# Patient Record
Sex: Male | Born: 1950
Health system: Southern US, Community
[De-identification: ages and names within clinical notes are randomized; demographics above are authoritative.]

## PROBLEM LIST (undated history)

## (undated) DIAGNOSIS — M2540 Effusion, unspecified joint: Secondary | ICD-10-CM

## (undated) DIAGNOSIS — R51 Headache: Secondary | ICD-10-CM

## (undated) DIAGNOSIS — M62838 Other muscle spasm: Secondary | ICD-10-CM

## (undated) DIAGNOSIS — M1990 Unspecified osteoarthritis, unspecified site: Secondary | ICD-10-CM

## (undated) DIAGNOSIS — M2550 Pain in unspecified joint: Secondary | ICD-10-CM

## (undated) DIAGNOSIS — E119 Type 2 diabetes mellitus without complications: Secondary | ICD-10-CM

## (undated) DIAGNOSIS — M549 Dorsalgia, unspecified: Secondary | ICD-10-CM

## (undated) DIAGNOSIS — I10 Essential (primary) hypertension: Secondary | ICD-10-CM

## (undated) DIAGNOSIS — K5790 Diverticulosis of intestine, part unspecified, without perforation or abscess without bleeding: Secondary | ICD-10-CM

## (undated) DIAGNOSIS — E785 Hyperlipidemia, unspecified: Secondary | ICD-10-CM

## (undated) DIAGNOSIS — K219 Gastro-esophageal reflux disease without esophagitis: Secondary | ICD-10-CM

## (undated) HISTORY — PX: KNEE ARTHROSCOPY: SUR90

## (undated) HISTORY — PX: COLONOSCOPY: SHX174

## (undated) HISTORY — PX: OTHER SURGICAL HISTORY: SHX169

## (undated) HISTORY — PX: ESOPHAGOGASTRODUODENOSCOPY: SHX1529

---

## 2013-02-14 ENCOUNTER — Encounter (HOSPITAL_COMMUNITY): Payer: Self-pay | Admitting: Respiratory Therapy

## 2013-02-18 ENCOUNTER — Encounter (HOSPITAL_COMMUNITY)
Admission: RE | Admit: 2013-02-18 | Discharge: 2013-02-18 | Disposition: A | Discharge status: Home / Self Care | Payer: BC Managed Care – PPO | Source: Ambulatory Visit | Attending: Orthopedic Surgery | Admitting: Orthopedic Surgery

## 2013-02-18 ENCOUNTER — Encounter (HOSPITAL_COMMUNITY): Payer: Self-pay

## 2013-02-18 ENCOUNTER — Encounter (HOSPITAL_COMMUNITY)
Admission: RE | Admit: 2013-02-18 | Discharge: 2013-02-18 | Disposition: A | Discharge status: Home / Self Care | Payer: BC Managed Care – PPO | Source: Ambulatory Visit | Attending: Anesthesiology | Admitting: Anesthesiology

## 2013-02-18 DIAGNOSIS — I10 Essential (primary) hypertension: Secondary | ICD-10-CM | POA: Insufficient documentation

## 2013-02-18 DIAGNOSIS — E119 Type 2 diabetes mellitus without complications: Secondary | ICD-10-CM | POA: Insufficient documentation

## 2013-02-18 DIAGNOSIS — Z01818 Encounter for other preprocedural examination: Secondary | ICD-10-CM | POA: Insufficient documentation

## 2013-02-18 DIAGNOSIS — K5730 Diverticulosis of large intestine without perforation or abscess without bleeding: Secondary | ICD-10-CM | POA: Insufficient documentation

## 2013-02-18 DIAGNOSIS — I4949 Other premature depolarization: Secondary | ICD-10-CM | POA: Insufficient documentation

## 2013-02-18 DIAGNOSIS — K219 Gastro-esophageal reflux disease without esophagitis: Secondary | ICD-10-CM | POA: Insufficient documentation

## 2013-02-18 DIAGNOSIS — Z01810 Encounter for preprocedural cardiovascular examination: Secondary | ICD-10-CM | POA: Insufficient documentation

## 2013-02-18 DIAGNOSIS — Z01812 Encounter for preprocedural laboratory examination: Secondary | ICD-10-CM | POA: Insufficient documentation

## 2013-02-18 DIAGNOSIS — M129 Arthropathy, unspecified: Secondary | ICD-10-CM | POA: Insufficient documentation

## 2013-02-18 DIAGNOSIS — E785 Hyperlipidemia, unspecified: Secondary | ICD-10-CM | POA: Insufficient documentation

## 2013-02-18 DIAGNOSIS — R51 Headache: Secondary | ICD-10-CM | POA: Insufficient documentation

## 2013-02-18 DIAGNOSIS — I4510 Unspecified right bundle-branch block: Secondary | ICD-10-CM | POA: Insufficient documentation

## 2013-02-18 HISTORY — DX: Pain in unspecified joint: M25.50

## 2013-02-18 HISTORY — DX: Type 2 diabetes mellitus without complications: E11.9

## 2013-02-18 HISTORY — DX: Hyperlipidemia, unspecified: E78.5

## 2013-02-18 HISTORY — DX: Effusion, unspecified joint: M25.40

## 2013-02-18 HISTORY — DX: Essential (primary) hypertension: I10

## 2013-02-18 HISTORY — DX: Headache: R51

## 2013-02-18 HISTORY — DX: Dorsalgia, unspecified: M54.9

## 2013-02-18 HISTORY — DX: Other muscle spasm: M62.838

## 2013-02-18 HISTORY — DX: Diverticulosis of intestine, part unspecified, without perforation or abscess without bleeding: K57.90

## 2013-02-18 HISTORY — DX: Gastro-esophageal reflux disease without esophagitis: K21.9

## 2013-02-18 HISTORY — DX: Unspecified osteoarthritis, unspecified site: M19.90

## 2013-02-18 LAB — TYPE AND SCREEN
ABO/RH(D): O POS
Antibody Screen: NEGATIVE

## 2013-02-18 LAB — CBC
Hemoglobin: 14 g/dL (ref 13.0–17.0)
MCH: 28.1 pg (ref 26.0–34.0)
MCV: 80.1 fL (ref 78.0–100.0)
Platelets: 219 10*3/uL (ref 150–400)
RBC: 4.98 MIL/uL (ref 4.22–5.81)
WBC: 5.6 10*3/uL (ref 4.0–10.5)

## 2013-02-18 LAB — BASIC METABOLIC PANEL
CO2: 29 mEq/L (ref 19–32)
Calcium: 9.6 mg/dL (ref 8.4–10.5)
GFR calc non Af Amer: 57 mL/min — ABNORMAL LOW (ref >90)
Glucose, Bld: 141 mg/dL — ABNORMAL HIGH (ref 70–99)
Potassium: 3.9 mEq/L (ref 3.5–5.1)
Sodium: 142 mEq/L (ref 135–145)

## 2013-02-18 LAB — SURGICAL PCR SCREEN: Staphylococcus aureus: POSITIVE — AB

## 2013-02-18 LAB — APTT: aPTT: 32 seconds (ref 24–37)

## 2013-02-18 LAB — PROTIME-INR: Prothrombin Time: 13.4 seconds (ref 11.6–15.2)

## 2013-02-18 MED ORDER — CHLORHEXIDINE GLUCONATE 4 % EX LIQD: 60.0000 mL | Freq: Once | CUTANEOUS | Status: DC

## 2013-02-18 NOTE — Progress Notes (Signed)
Pt doesn't have a cardiologist  Dr.Michael Leonette Most is Medical Md   Stress test > 5yrs ago  Denies ever having an echo/heart cath  Denies CXR or EKG in past yr

## 2013-02-18 NOTE — Pre-Procedure Instructions (Signed)
Jimmy Frazier  02/18/2013   Your procedure is scheduled on:  Fri, April 25 @ 12:30 PM  Report to [facility] at 10:30 AM.  Call this number if you have problems the morning of surgery: [phone]   Remember:   Do not eat food or drink liquids after midnight.   Take these medicines the morning of surgery with A SIP OF WATER: Felodipine(Plendil),Robaxin(Methocarbamol), and Omeprazole(Prilosec)               Stop taking your Ibuprofen and using Diclofenac gel.No Aspirin,Goody's,BC's,Aleve,Fish Oil,or Herbal Medications   Do not wear jewelry  Do not wear lotions, powders, or colognes. You may wear deodorant.  Men may shave face and neck.  Do not bring valuables to the hospital.  Contacts, dentures or bridgework may not be worn into surgery.  Leave suitcase in the car. After surgery it may be brought to your room.  For patients admitted to the hospital, checkout time is 11:00 AM the day of  discharge.   Patients discharged the day of surgery will not be allowed to drive  home.    Special Instructions: Shower using CHG 2 nights before surgery and the night before surgery.  If you shower the day of surgery use CHG.  Use special wash - you have one bottle of CHG for all showers.  You should use approximately 1/3 of the bottle for each shower.   Please read over the following fact sheets that you were given: Pain Booklet, Coughing and Deep Breathing, Blood Transfusion Information, MRSA Information and Surgical Site Infection Prevention

## 2013-02-21 NOTE — Progress Notes (Signed)
Anesthesia Chart Review:  Patient is a 62 year old male scheduled for left total shoulder arthroplasty versus reverse total shoulder arthroplasty on 02/25/13 by Dr. Ranell Patrick.  History includes non-smoker, HTN, HLD, headaches, diverticulosis, arthritis, GERD, "borderline" DM2.  PCP is Dr. Loyal Jacobson who cleared patient for this procedure.  EKG on 02/18/13 showed SR with occasional PVC, LAD, incomplete right BBB, moderate voltage criteria for LVH.  Incomplete right BBB and singe PVC is new, otherwise his EKG is not significantly changed since 11/19/11 (PCP).  He reported a stress test > 5 years ago.  CXR report on 02/18/13 showed suboptimal inspiration with bibasilar atelectasis. No acute findings identified.   Preoperative labs noted.  Cr 1.32.  Glucose 141.  CBC, PT/PTT WNL.  Patient has been medically cleared for this procedure.  If no acute changes then would anticipate he could proceed as planned.  He will be evaluated by his assigned anesthesiologist on the day of surgery.  Velna Ochs [facility]/Anesthesiology Phone [phone] 02/21/2013 10:27 AM

## 2013-02-24 MED ORDER — CEFAZOLIN SODIUM-DEXTROSE 2-3 GM-% IV SOLR
2.0000 g | INTRAVENOUS | Status: AC
  Administered 2013-02-25: 2 g via INTRAVENOUS
  Filled 2013-02-24: qty 50

## 2013-02-24 NOTE — Progress Notes (Signed)
Spoke with wife regarding time change. Verbalized understanding

## 2013-02-24 NOTE — H&P (Signed)
Jimmy Frazier is an 62 y.o. male.    Chief Complaint: left shoulder pain   HPI: Pt is a 62 y.o. male complaining of left shoulder pain for multiple years. Pain had continually increased since the beginning. X-rays in the clinic show end-stage arthritic changes of the left shoulder. Pt has tried various conservative treatments which have failed to alleviate their symptoms, including therapy and injections. Various options are discussed with the patient. Risks, benefits and expectations were discussed with the patient. Patient understand the risks, benefits and expectations and wishes to proceed with surgery.   PCP:  Sid Falcon, MD  D/C Plans:  Home with HHPT  PMH: Past Medical History  Diagnosis Date  . Hypertension     takes Prinizide and Felodipine daily  . Hyperlipidemia     but doesn't require meds  . Headache     hx of cluster HA;but none in a while  . Muscle spasm     Robaxin daily prn  . Back pain     occasionally  . Joint pain   . Joint swelling   . Arthritis   . GERD (gastroesophageal reflux disease)     takes Omeprazole daily prn  . Diverticulosis   . Diabetes mellitus without complication     borderline;but no meds required yet;diet and exercise    PSH: Past Surgical History  Procedure Laterality Date  . Knee arthroscopy      2 on right and 1 on left  . Right shoulder arthroscopy    . Esophagogastroduodenoscopy    . Colonoscopy      Social History:  reports that he has never smoked. He does not have any smokeless tobacco history on file. He reports that  drinks alcohol. He reports that he does not use illicit drugs.  Allergies:  No Known Allergies  Medications: Current Facility-Administered Medications  Medication Dose Route Frequency Provider Last Rate Last Dose  . [START ON 02/25/2013] ceFAZolin (ANCEF) IVPB 2 g/50 mL premix  2 g Intravenous On Call to OR Thea Gist, PA-C       Current Outpatient Prescriptions  Medication Sig Dispense  Refill  . aspirin 81 MG tablet Take 81 mg by mouth daily.      . diclofenac sodium (VOLTAREN) 1 % GEL Apply 4 g topically 4 (four) times daily as needed (for joint pain).       . felodipine (PLENDIL) 10 MG 24 hr tablet Take 10 mg by mouth daily.      Marland Kitchen ibuprofen (ADVIL,MOTRIN) 200 MG tablet Take 800 mg by mouth every 8 (eight) hours as needed for pain.      Marland Kitchen lisinopril-hydrochlorothiazide (PRINZIDE,ZESTORETIC) 20-12.5 MG per tablet Take 1 tablet by mouth daily.      . methocarbamol (ROBAXIN) 500 MG tablet Take 500 mg by mouth 4 (four) times daily as needed (for muscle pain).       Marland Kitchen omeprazole (PRILOSEC) 20 MG capsule Take 20 mg by mouth daily.        No results found for this or any previous visit (from the past 48 hour(s)). No results found.  ROS: Pain with rom of the left upper extremity  Physical Exam:  Alert and oriented 62 y.o. male in no acute distress Cranial nerves 2-12 intact Cervical spine: full rom with no tenderness, nv intact distally Chest: active breath sounds bilaterally, no wheeze rhonchi or rales Heart: regular rate and rhythm, no murmur Abd: non tender non distended with active bowel sounds Hip  is stable with rom  Moderate pain and decreased rom due to arthritis nv intact distally Mild decrease in strength  Assessment/Plan Assessment: left shoulder end stage arthritis  Plan: Patient will undergo a left total shoulder vs reverse by Dr. Ranell Patrick at [facility]. Risks benefits and expectations were discussed with the patient. Patient understand risks, benefits and expectations and wishes to proceed.

## 2013-02-25 ENCOUNTER — Encounter (HOSPITAL_COMMUNITY): Payer: Self-pay | Admitting: Vascular Surgery

## 2013-02-25 ENCOUNTER — Inpatient Hospital Stay (HOSPITAL_COMMUNITY)
Admission: RE | Admit: 2013-02-25 | Discharge: 2013-02-27 | DRG: 491 | Disposition: A | Discharge status: Home / Self Care | Payer: BC Managed Care – PPO | Source: Ambulatory Visit | Attending: Orthopedic Surgery | Admitting: Orthopedic Surgery

## 2013-02-25 ENCOUNTER — Inpatient Hospital Stay (HOSPITAL_COMMUNITY): Payer: BC Managed Care – PPO

## 2013-02-25 ENCOUNTER — Encounter (HOSPITAL_COMMUNITY): Payer: Self-pay | Admitting: Certified Registered Nurse Anesthetist

## 2013-02-25 ENCOUNTER — Ambulatory Visit (HOSPITAL_COMMUNITY): Payer: BC Managed Care – PPO | Admitting: Certified Registered Nurse Anesthetist

## 2013-02-25 ENCOUNTER — Encounter (HOSPITAL_COMMUNITY)
Admission: RE | Disposition: A | Discharge status: Home / Self Care | Payer: Self-pay | Source: Ambulatory Visit | Attending: Orthopedic Surgery

## 2013-02-25 DIAGNOSIS — K219 Gastro-esophageal reflux disease without esophagitis: Secondary | ICD-10-CM | POA: Diagnosis present

## 2013-02-25 DIAGNOSIS — M19019 Primary osteoarthritis, unspecified shoulder: Principal | ICD-10-CM | POA: Diagnosis present

## 2013-02-25 DIAGNOSIS — I10 Essential (primary) hypertension: Secondary | ICD-10-CM | POA: Diagnosis present

## 2013-02-25 DIAGNOSIS — Z79899 Other long term (current) drug therapy: Secondary | ICD-10-CM

## 2013-02-25 DIAGNOSIS — E785 Hyperlipidemia, unspecified: Secondary | ICD-10-CM | POA: Diagnosis present

## 2013-02-25 DIAGNOSIS — Z7982 Long term (current) use of aspirin: Secondary | ICD-10-CM

## 2013-02-25 DIAGNOSIS — E119 Type 2 diabetes mellitus without complications: Secondary | ICD-10-CM | POA: Diagnosis present

## 2013-02-25 DIAGNOSIS — M62838 Other muscle spasm: Secondary | ICD-10-CM | POA: Diagnosis present

## 2013-02-25 DIAGNOSIS — Z96619 Presence of unspecified artificial shoulder joint: Secondary | ICD-10-CM

## 2013-02-25 HISTORY — PX: TOTAL SHOULDER ARTHROPLASTY: SHX126

## 2013-02-25 LAB — GLUCOSE, CAPILLARY
Glucose-Capillary: 116 mg/dL — ABNORMAL HIGH (ref 70–99)
Glucose-Capillary: 154 mg/dL — ABNORMAL HIGH (ref 70–99)

## 2013-02-25 SURGERY — ARTHROPLASTY, SHOULDER, TOTAL
Anesthesia: General | Site: Shoulder | Laterality: Left | Wound class: Clean

## 2013-02-25 MED ORDER — ONDANSETRON HCL 4 MG PO TABS: 4.0000 mg | ORAL_TABLET | Freq: Four times a day (QID) | ORAL | Status: DC | PRN

## 2013-02-25 MED ORDER — OXYCODONE-ACETAMINOPHEN 5-325 MG PO TABS
1.0000 | ORAL_TABLET | ORAL | Status: DC | PRN
  Administered 2013-02-25 – 2013-02-27 (×6): 2 via ORAL
  Filled 2013-02-25 (×7): qty 2

## 2013-02-25 MED ORDER — BUPIVACAINE-EPINEPHRINE PF 0.5-1:200000 % IJ SOLN
INTRAMUSCULAR | Status: DC | PRN
  Administered 2013-02-25: 150 mg

## 2013-02-25 MED ORDER — METHOCARBAMOL 100 MG/ML IJ SOLN
500.0000 mg | Freq: Four times a day (QID) | INTRAVENOUS | Status: DC | PRN
  Filled 2013-02-25: qty 5

## 2013-02-25 MED ORDER — ONDANSETRON HCL 4 MG/2ML IJ SOLN
INTRAMUSCULAR | Status: DC | PRN
  Administered 2013-02-25: 4 mg via INTRAVENOUS

## 2013-02-25 MED ORDER — LISINOPRIL 20 MG PO TABS
20.0000 mg | ORAL_TABLET | Freq: Every day | ORAL | Status: DC
  Administered 2013-02-25 – 2013-02-26 (×2): 20 mg via ORAL
  Filled 2013-02-25 (×3): qty 1

## 2013-02-25 MED ORDER — EPHEDRINE SULFATE 50 MG/ML IJ SOLN
INTRAMUSCULAR | Status: DC | PRN
  Administered 2013-02-25: 5 mg via INTRAVENOUS
  Administered 2013-02-25 (×2): 10 mg via INTRAVENOUS
  Administered 2013-02-25 (×3): 5 mg via INTRAVENOUS
  Administered 2013-02-25: 10 mg via INTRAVENOUS

## 2013-02-25 MED ORDER — PANTOPRAZOLE SODIUM 40 MG PO TBEC
40.0000 mg | DELAYED_RELEASE_TABLET | Freq: Every day | ORAL | Status: DC
  Administered 2013-02-26: 40 mg via ORAL
  Filled 2013-02-25: qty 1

## 2013-02-25 MED ORDER — HYDROMORPHONE HCL PF 1 MG/ML IJ SOLN
0.5000 mg | INTRAMUSCULAR | Status: DC | PRN
  Administered 2013-02-26: 0.5 mg via INTRAVENOUS
  Filled 2013-02-25: qty 1

## 2013-02-25 MED ORDER — METHOCARBAMOL 500 MG PO TABS
500.0000 mg | ORAL_TABLET | Freq: Four times a day (QID) | ORAL | Status: DC | PRN
  Administered 2013-02-26 (×3): 500 mg via ORAL
  Filled 2013-02-25 (×3): qty 1

## 2013-02-25 MED ORDER — POTASSIUM CHLORIDE IN NACL 20-0.9 MEQ/L-% IV SOLN
INTRAVENOUS | Status: DC
  Administered 2013-02-25: 18:00:00 via INTRAVENOUS
  Filled 2013-02-25 (×3): qty 1000

## 2013-02-25 MED ORDER — PHENYLEPHRINE HCL 10 MG/ML IJ SOLN
INTRAMUSCULAR | Status: DC | PRN
  Administered 2013-02-25 (×4): 160 ug via INTRAVENOUS
  Administered 2013-02-25: 80 ug via INTRAVENOUS
  Administered 2013-02-25 (×2): 40 ug via INTRAVENOUS

## 2013-02-25 MED ORDER — OXYCODONE-ACETAMINOPHEN 5-325 MG PO TABS: 1.0000 | ORAL_TABLET | ORAL | Status: DC | PRN

## 2013-02-25 MED ORDER — OXYCODONE HCL 5 MG/5ML PO SOLN: 5.0000 mg | Freq: Once | ORAL | Status: DC | PRN

## 2013-02-25 MED ORDER — PHENOL 1.4 % MT LIQD: 1.0000 | OROMUCOSAL | Status: DC | PRN

## 2013-02-25 MED ORDER — BUPIVACAINE-EPINEPHRINE PF 0.25-1:200000 % IJ SOLN
INTRAMUSCULAR | Status: AC
  Filled 2013-02-25: qty 30

## 2013-02-25 MED ORDER — METOCLOPRAMIDE HCL 10 MG PO TABS: 5.0000 mg | ORAL_TABLET | Freq: Three times a day (TID) | ORAL | Status: DC | PRN

## 2013-02-25 MED ORDER — FENTANYL CITRATE 0.05 MG/ML IJ SOLN: 50.0000 ug | INTRAMUSCULAR | Status: DC | PRN

## 2013-02-25 MED ORDER — BUPIVACAINE-EPINEPHRINE 0.25% -1:200000 IJ SOLN
INTRAMUSCULAR | Status: DC | PRN
  Administered 2013-02-25 (×2): 9 mL

## 2013-02-25 MED ORDER — THROMBIN 5000 UNITS EX SOLR
CUTANEOUS | Status: DC | PRN
  Administered 2013-02-25: 5000 [IU] via TOPICAL

## 2013-02-25 MED ORDER — LIDOCAINE HCL 4 % MT SOLN
OROMUCOSAL | Status: DC | PRN
  Administered 2013-02-25: 4 mL via TOPICAL

## 2013-02-25 MED ORDER — MENTHOL 3 MG MT LOZG: 1.0000 | LOZENGE | OROMUCOSAL | Status: DC | PRN

## 2013-02-25 MED ORDER — PROPOFOL 10 MG/ML IV BOLUS
INTRAVENOUS | Status: DC | PRN
  Administered 2013-02-25: 120 mg via INTRAVENOUS

## 2013-02-25 MED ORDER — MIDAZOLAM HCL 2 MG/2ML IJ SOLN: 1.0000 mg | INTRAMUSCULAR | Status: DC | PRN

## 2013-02-25 MED ORDER — METHOCARBAMOL 500 MG PO TABS
500.0000 mg | ORAL_TABLET | Freq: Four times a day (QID) | ORAL | Status: DC | PRN
  Administered 2013-02-27: 500 mg via ORAL
  Filled 2013-02-25 (×2): qty 1

## 2013-02-25 MED ORDER — LISINOPRIL-HYDROCHLOROTHIAZIDE 20-12.5 MG PO TABS: 1.0000 | ORAL_TABLET | Freq: Every day | ORAL | Status: DC

## 2013-02-25 MED ORDER — BISACODYL 10 MG RE SUPP: 10.0000 mg | Freq: Every day | RECTAL | Status: DC | PRN

## 2013-02-25 MED ORDER — OXYCODONE HCL 5 MG PO TABS: 5.0000 mg | ORAL_TABLET | Freq: Once | ORAL | Status: DC | PRN

## 2013-02-25 MED ORDER — MIDAZOLAM HCL 5 MG/5ML IJ SOLN
INTRAMUSCULAR | Status: DC | PRN
  Administered 2013-02-25: 2 mg via INTRAVENOUS

## 2013-02-25 MED ORDER — ACETAMINOPHEN 650 MG RE SUPP: 650.0000 mg | Freq: Four times a day (QID) | RECTAL | Status: DC | PRN

## 2013-02-25 MED ORDER — ASPIRIN 81 MG PO TABS: 81.0000 mg | ORAL_TABLET | Freq: Every day | ORAL | Status: DC

## 2013-02-25 MED ORDER — FENTANYL CITRATE 0.05 MG/ML IJ SOLN
INTRAMUSCULAR | Status: AC
  Administered 2013-02-25: 100 ug via INTRAVENOUS
  Filled 2013-02-25: qty 2

## 2013-02-25 MED ORDER — LACTATED RINGERS IV SOLN
INTRAVENOUS | Status: DC
  Administered 2013-02-25: 50 mL/h via INTRAVENOUS

## 2013-02-25 MED ORDER — ONDANSETRON HCL 4 MG/2ML IJ SOLN: 4.0000 mg | Freq: Four times a day (QID) | INTRAMUSCULAR | Status: DC | PRN

## 2013-02-25 MED ORDER — DEXAMETHASONE SODIUM PHOSPHATE 4 MG/ML IJ SOLN
INTRAMUSCULAR | Status: DC | PRN
  Administered 2013-02-25: 8 mg

## 2013-02-25 MED ORDER — SODIUM CHLORIDE 0.9 % IR SOLN
Status: DC | PRN
  Administered 2013-02-25: 1000 mL

## 2013-02-25 MED ORDER — FELODIPINE ER 10 MG PO TB24
10.0000 mg | ORAL_TABLET | Freq: Every day | ORAL | Status: DC
  Administered 2013-02-26: 10 mg via ORAL
  Filled 2013-02-25 (×2): qty 1

## 2013-02-25 MED ORDER — SUCCINYLCHOLINE CHLORIDE 20 MG/ML IJ SOLN
INTRAMUSCULAR | Status: DC | PRN
  Administered 2013-02-25: 100 mg via INTRAVENOUS

## 2013-02-25 MED ORDER — ASPIRIN EC 81 MG PO TBEC
81.0000 mg | DELAYED_RELEASE_TABLET | Freq: Every day | ORAL | Status: DC
  Administered 2013-02-25 – 2013-02-26 (×2): 81 mg via ORAL
  Filled 2013-02-25 (×3): qty 1

## 2013-02-25 MED ORDER — METOCLOPRAMIDE HCL 5 MG/ML IJ SOLN: 5.0000 mg | Freq: Three times a day (TID) | INTRAMUSCULAR | Status: DC | PRN

## 2013-02-25 MED ORDER — PROMETHAZINE HCL 25 MG/ML IJ SOLN: 6.2500 mg | INTRAMUSCULAR | Status: DC | PRN

## 2013-02-25 MED ORDER — MIDAZOLAM HCL 2 MG/2ML IJ SOLN
INTRAMUSCULAR | Status: AC
  Administered 2013-02-25: 2 mg via INTRAVENOUS
  Filled 2013-02-25: qty 2

## 2013-02-25 MED ORDER — ACETAMINOPHEN 325 MG PO TABS: 650.0000 mg | ORAL_TABLET | Freq: Four times a day (QID) | ORAL | Status: DC | PRN

## 2013-02-25 MED ORDER — LIDOCAINE HCL (CARDIAC) 20 MG/ML IV SOLN
INTRAVENOUS | Status: DC | PRN
  Administered 2013-02-25: 30 mg via INTRAVENOUS

## 2013-02-25 MED ORDER — LACTATED RINGERS IV SOLN
INTRAVENOUS | Status: DC | PRN
  Administered 2013-02-25 (×2): via INTRAVENOUS

## 2013-02-25 MED ORDER — THROMBIN 5000 UNITS EX SOLR
CUTANEOUS | Status: AC
  Filled 2013-02-25: qty 5000

## 2013-02-25 MED ORDER — FENTANYL CITRATE 0.05 MG/ML IJ SOLN
INTRAMUSCULAR | Status: DC | PRN
  Administered 2013-02-25: 50 ug via INTRAVENOUS

## 2013-02-25 MED ORDER — HYDROCHLOROTHIAZIDE 12.5 MG PO CAPS
12.5000 mg | ORAL_CAPSULE | Freq: Every day | ORAL | Status: DC
  Administered 2013-02-25 – 2013-02-26 (×2): 12.5 mg via ORAL
  Filled 2013-02-25 (×3): qty 1

## 2013-02-25 MED ORDER — HYDROMORPHONE HCL PF 1 MG/ML IJ SOLN: 0.2500 mg | INTRAMUSCULAR | Status: DC | PRN

## 2013-02-25 SURGICAL SUPPLY — 79 items
BLADE SAG 18X100X1.27 (BLADE) ×2 IMPLANT
BLADE SAW SAG 73X25 THK (BLADE) ×1
BLADE SAW SGTL 73X25 THK (BLADE) ×1 IMPLANT
BUR SURG 4X8 MED (BURR) ×1 IMPLANT
BURR SURG 4X8 MED (BURR) ×2
CEMENT BONE DEPUY (Cement) ×2 IMPLANT
CLOTH BEACON ORANGE TIMEOUT ST (SAFETY) ×2 IMPLANT
CLSR STERI-STRIP ANTIMIC 1/2X4 (GAUZE/BANDAGES/DRESSINGS) ×2 IMPLANT
COVER SURGICAL LIGHT HANDLE (MISCELLANEOUS) ×2 IMPLANT
DRAPE INCISE IOBAN 66X45 STRL (DRAPES) ×4 IMPLANT
DRAPE U-SHAPE 47X51 STRL (DRAPES) ×2 IMPLANT
DRAPE X-RAY CASS 24X20 (DRAPES) IMPLANT
DRILL BIT 5/64 (BIT) ×2 IMPLANT
DRSG ADAPTIC 3X8 NADH LF (GAUZE/BANDAGES/DRESSINGS) ×2 IMPLANT
DRSG PAD ABDOMINAL 8X10 ST (GAUZE/BANDAGES/DRESSINGS) ×4 IMPLANT
DURAPREP 26ML APPLICATOR (WOUND CARE) ×2 IMPLANT
ELECT BLADE 4.0 EZ CLEAN MEGAD (MISCELLANEOUS) ×2
ELECT NEEDLE TIP 2.8 STRL (NEEDLE) ×2 IMPLANT
ELECT REM PT RETURN 9FT ADLT (ELECTROSURGICAL) ×2
ELECTRODE BLDE 4.0 EZ CLN MEGD (MISCELLANEOUS) ×1 IMPLANT
ELECTRODE REM PT RTRN 9FT ADLT (ELECTROSURGICAL) ×1 IMPLANT
GLENOID ANCHOR PEG CROSSLK 44 (Orthopedic Implant) ×2 IMPLANT
GLOVE BIOGEL PI ORTHO PRO 7.5 (GLOVE) ×1
GLOVE BIOGEL PI ORTHO PRO SZ7 (GLOVE) ×1
GLOVE BIOGEL PI ORTHO PRO SZ8 (GLOVE) ×1
GLOVE ORTHO TXT STRL SZ7.5 (GLOVE) ×2 IMPLANT
GLOVE PI ORTHO PRO STRL 7.5 (GLOVE) ×1 IMPLANT
GLOVE PI ORTHO PRO STRL SZ7 (GLOVE) ×1 IMPLANT
GLOVE PI ORTHO PRO STRL SZ8 (GLOVE) ×1 IMPLANT
GLOVE SURG ORTHO 8.5 STRL (GLOVE) ×4 IMPLANT
GLOVE SURG SS PI 7.0 STRL IVOR (GLOVE) ×2 IMPLANT
GOWN STRL NON-REIN LRG LVL3 (GOWN DISPOSABLE) IMPLANT
GOWN STRL REIN XL XLG (GOWN DISPOSABLE) ×6 IMPLANT
HANDPIECE INTERPULSE COAX TIP (DISPOSABLE)
HEAD HUM ECCENTRIC 44X18 STRL (Trauma) ×2 IMPLANT
IV CATH 14GX2 1/4 (CATHETERS) ×2 IMPLANT
KIT BASIN OR (CUSTOM PROCEDURE TRAY) ×2 IMPLANT
KIT ROOM TURNOVER OR (KITS) ×2 IMPLANT
MANIFOLD NEPTUNE II (INSTRUMENTS) ×2 IMPLANT
NDL SUT 6 .5 CRC .975X.05 MAYO (NEEDLE) ×1 IMPLANT
NEEDLE 1/2 CIR MAYO (NEEDLE) ×2 IMPLANT
NEEDLE HYPO 25GX1X1/2 BEV (NEEDLE) ×2 IMPLANT
NEEDLE MAYO TAPER (NEEDLE) ×1
NS IRRIG 1000ML POUR BTL (IV SOLUTION) ×2 IMPLANT
PACK SHOULDER (CUSTOM PROCEDURE TRAY) ×2 IMPLANT
PAD ARMBOARD 7.5X6 YLW CONV (MISCELLANEOUS) ×4 IMPLANT
PIN METAGLENE 2.5 (PIN) ×2 IMPLANT
SET HNDPC FAN SPRY TIP SCT (DISPOSABLE) IMPLANT
SLING ARM FOAM STRAP LRG (SOFTGOODS) ×2 IMPLANT
SLING ARM FOAM STRAP MED (SOFTGOODS) IMPLANT
SLING ARM IMMOBILIZER LRG (SOFTGOODS) IMPLANT
SLING ARM IMMOBILIZER MED (SOFTGOODS) IMPLANT
SMARTMIX MINI TOWER (MISCELLANEOUS) ×2
SPONGE GAUZE 4X4 12PLY (GAUZE/BANDAGES/DRESSINGS) ×2 IMPLANT
SPONGE LAP 18X18 X RAY DECT (DISPOSABLE) ×2 IMPLANT
SPONGE LAP 4X18 X RAY DECT (DISPOSABLE) ×2 IMPLANT
SPONGE SURGIFOAM ABS GEL SZ50 (HEMOSTASIS) ×2 IMPLANT
STEM HUMERAL 12MM (Trauma) ×2 IMPLANT
STRIP CLOSURE SKIN 1/2X4 (GAUZE/BANDAGES/DRESSINGS) ×2 IMPLANT
SUCTION FRAZIER TIP 10 FR DISP (SUCTIONS) ×2 IMPLANT
SUT FIBERWIRE #2 38 T-5 BLUE (SUTURE) ×6
SUT MNCRL AB 4-0 PS2 18 (SUTURE) ×2 IMPLANT
SUT VIC AB 0 CT1 27 (SUTURE) ×1
SUT VIC AB 0 CT1 27XBRD ANBCTR (SUTURE) ×1 IMPLANT
SUT VIC AB 2-0 CT1 27 (SUTURE) ×1
SUT VIC AB 2-0 CT1 TAPERPNT 27 (SUTURE) ×1 IMPLANT
SUT VICRYL 0 CT 1 36IN (SUTURE) ×2 IMPLANT
SUT VICRYL AB 2 0 TIES (SUTURE) IMPLANT
SUTURE FIBERWR #2 38 T-5 BLUE (SUTURE) ×3 IMPLANT
SWABSTICK BENZOIN STERILE (MISCELLANEOUS) IMPLANT
SYR CONTROL 10ML LL (SYRINGE) ×4 IMPLANT
TAPE CLOTH SURG 6X10 WHT LF (GAUZE/BANDAGES/DRESSINGS) ×2 IMPLANT
TOWEL OR 17X24 6PK STRL BLUE (TOWEL DISPOSABLE) ×2 IMPLANT
TOWEL OR 17X26 10 PK STRL BLUE (TOWEL DISPOSABLE) ×2 IMPLANT
TOWER CARTRIDGE SMART MIX (DISPOSABLE) IMPLANT
TOWER SMARTMIX MINI (MISCELLANEOUS) ×1 IMPLANT
TRAY FOLEY CATH 14FR (SET/KITS/TRAYS/PACK) ×2 IMPLANT
WATER STERILE IRR 1000ML POUR (IV SOLUTION) ×2 IMPLANT
YANKAUER SUCT BULB TIP NO VENT (SUCTIONS) IMPLANT

## 2013-02-25 NOTE — Progress Notes (Signed)
Report rec'd from Mark RN, assuming care of patient   

## 2013-02-25 NOTE — Anesthesia Procedure Notes (Addendum)
Anesthesia Regional Block:  Interscalene brachial plexus block  Pre-Anesthetic Checklist: ,, timeout performed, Correct Patient, Correct Site, Correct Laterality, Correct Procedure, Correct Position, site marked, Risks and benefits discussed,  Surgical consent,  Pre-op evaluation,  At surgeon's request and post-op pain management  Laterality: Right  Prep: chloraprep       Needles:  Injection technique: Single-shot  Needle Type: Echogenic Stimulator Needle     Needle Length: 5cm 5 cm Needle Gauge: 22 and 22 G    Additional Needles:  Procedures: ultrasound guided (picture in chart) and nerve stimulator Interscalene brachial plexus block  Nerve Stimulator or Paresthesia:  Response: bicep contraction, 0.45 mA,   Additional Responses:   Narrative:  Start time: 02/25/2013 10:40 AM End time: 02/25/2013 10:50 AM Injection made incrementally with aspirations every 5 mL.  Performed by: Personally  Anesthesiologist: J. Adonis Huguenin, MD  Additional Notes: Functioning IV was confirmed and monitors applied.  A 50mm 22ga echogenic arrow stimulator was used. Sterile prep and drape,hand hygiene and sterile gloves were used.Ultrasound guidance: relevant anatomy identified, needle position confirmed, local anesthetic spread visualized around nerve(s)., vascular puncture avoided.  Image printed for medical record.  Negative aspiration and negative test dose prior to incremental administration of local anesthetic. The patient tolerated the procedure well.  Interscalene brachial plexus block Procedure Name: Intubation Date/Time: 02/25/2013 11:12 AM Performed by: Angelica Pou Pre-anesthesia Checklist: Patient identified, Timeout performed, Emergency Drugs available, Suction available and Patient being monitored Patient Re-evaluated:Patient Re-evaluated prior to inductionOxygen Delivery Method: Circle system utilized Preoxygenation: Pre-oxygenation with 100% oxygen Intubation Type: IV  induction Ventilation: Mask ventilation without difficulty Laryngoscope Size: Mac and 4 Grade View: Grade I Tube type: Oral Tube size: 7.5 mm Number of attempts: 1 Airway Equipment and Method: Stylet Placement Confirmation: ETT inserted through vocal cords under direct vision,  breath sounds checked- equal and bilateral and positive ETCO2 Secured at: 23 cm Tube secured with: Tape Dental Injury: Teeth and Oropharynx as per pre-operative assessment

## 2013-02-25 NOTE — Anesthesia Preprocedure Evaluation (Signed)
Anesthesia Evaluation  Patient identified by MRN, date of birth, ID band Patient awake    Reviewed: Allergy & Precautions, H&P , NPO status , Patient's Chart, lab work & pertinent test results  Airway Mallampati: II TM Distance: >3 FB Neck ROM: full    Dental  (+) Teeth Intact and Dental Advidsory Given   Pulmonary neg pulmonary ROS,    Pulmonary exam normal       Cardiovascular hypertension, On Medications     Neuro/Psych  Headaches, negative psych ROS   GI/Hepatic Neg liver ROS, GERD-  Medicated,  Endo/Other  diabetes  Renal/GU negative Renal ROS  negative genitourinary   Musculoskeletal   Abdominal Normal abdominal exam  (+)   Peds  Hematology negative hematology ROS (+)   Anesthesia Other Findings   Reproductive/Obstetrics                           Anesthesia Physical Anesthesia Plan  ASA: II  Anesthesia Plan: General ETT   Post-op Pain Management: MAC Combined w/ Regional for Post-op pain   Induction:   Airway Management Planned:   Additional Equipment:   Intra-op Plan:   Post-operative Plan:   Informed Consent: I have reviewed the patients History and Physical, chart, labs and discussed the procedure including the risks, benefits and alternatives for the proposed anesthesia with the patient or authorized representative who has indicated his/her understanding and acceptance.   Dental Advisory Given  Plan Discussed with: Anesthesiologist, CRNA and Surgeon  Anesthesia Plan Comments:         Anesthesia Quick Evaluation

## 2013-02-25 NOTE — Transfer of Care (Signed)
Immediate Anesthesia Transfer of Care Note  Patient: Jimmy Frazier  Procedure(s) Performed: Procedure(s): LEFT TOTAL SHOULDER ARTHROPLASTY  (Left)  Patient Location: PACU  Anesthesia Type:General  Level of Consciousness: awake, alert , sedated and patient cooperative  Airway & Oxygen Therapy: Patient Spontanous Breathing and Patient connected to nasal cannula oxygen  Post-op Assessment: Report given to PACU RN and Post -op Vital signs reviewed and stable  Post vital signs: Reviewed and stable  Complications: No apparent anesthesia complications

## 2013-02-25 NOTE — Brief Op Note (Signed)
02/25/2013  1:45 PM  PATIENT:  Jimmy Frazier  62 y.o. male  PRE-OPERATIVE DIAGNOSIS:  LEFT SHOULDER OA, end stage  POST-OPERATIVE DIAGNOSIS:  LEFT SHOULDER OA, end stage  PROCEDURE:  Procedure(s): LEFT TOTAL SHOULDER ARTHROPLASTY  (Left), [employer]  SURGEON:  Surgeon(s) and Role:    * Verlee Rossetti, MD - Primary  PHYSICIAN ASSISTANT:   ASSISTANTS: Thea Gist, PA-C   ANESTHESIA:   regional and general  EBL:  Total I/O In: 1700 [I.V.:1700] Out: 50 [Blood:50]  BLOOD ADMINISTERED:none  DRAINS: none   LOCAL MEDICATIONS USED:  MARCAINE     SPECIMEN:  No Specimen  DISPOSITION OF SPECIMEN:  N/A  COUNTS:  YES  TOURNIQUET:  * No tourniquets in log *  DICTATION: .Other Dictation: Dictation Number [id number]  PLAN OF CARE: Admit to inpatient   PATIENT DISPOSITION:  PACU - hemodynamically stable.   Delay start of Pharmacological VTE agent (>24hrs) due to surgical blood loss or risk of bleeding: not applicable

## 2013-02-25 NOTE — Preoperative (Signed)
Beta Blockers   Reason not to administer Beta Blockers:Not Applicable 

## 2013-02-25 NOTE — Interval H&P Note (Signed)
History and Physical Interval Note:  02/25/2013 10:25 AM  Jimmy Frazier  has presented today for surgery, with the diagnosis of LEFT SHOULDER OA   The various methods of treatment have been discussed with the patient and family. After consideration of risks, benefits and other options for treatment, the patient has consented to  Procedure(s): LEFT TOTAL SHOULDER ARTHROPLASTY VERSES  (Left) REVERSE TOTAL SHOULDER ARTHROPLASTY (Left) as a surgical intervention .  The patient's history has been reviewed, patient examined, no change in status, stable for surgery.  I have reviewed the patient's chart and labs.  Questions were answered to the patient's satisfaction.     Legend Pecore,STEVEN R

## 2013-02-25 NOTE — Anesthesia Postprocedure Evaluation (Signed)
  Anesthesia Post-op Note  Patient: Jimmy Frazier  Procedure(s) Performed: Procedure(s): LEFT TOTAL SHOULDER ARTHROPLASTY  (Left)  Patient Location: PACU  Anesthesia Type:GA combined with regional for post-op pain  Level of Consciousness: awake, alert  and oriented  Airway and Oxygen Therapy: Patient Spontanous Breathing and Patient connected to nasal cannula oxygen  Post-op Pain: none  Post-op Assessment: Post-op Vital signs reviewed  Post-op Vital Signs: Reviewed  Complications: No apparent anesthesia complications

## 2013-02-26 LAB — BASIC METABOLIC PANEL
BUN: 17 mg/dL (ref 6–23)
Chloride: 105 mEq/L (ref 96–112)
Creatinine, Ser: 1.09 mg/dL (ref 0.50–1.35)
GFR calc non Af Amer: 72 mL/min — ABNORMAL LOW (ref >90)
Glucose, Bld: 169 mg/dL — ABNORMAL HIGH (ref 70–99)
Potassium: 4.2 mEq/L (ref 3.5–5.1)

## 2013-02-26 NOTE — Progress Notes (Signed)
Subjective: 1 Day Post-Op Procedure(s) (LRB): LEFT TOTAL SHOULDER ARTHROPLASTY  (Left) Patient reports pain as 3 on 0-10 scale. Weakness of elbow flexors and wrist extension on left with diffuse sensory changes. Good radial Pulse. Will splint his wrist.   Objective: Vital signs in last 24 hours: Temp:  [97.1 F (36.2 C)-98.7 F (37.1 C)] 97.1 F (36.2 C) (04/26 0623) Pulse Rate:  [76-101] 96 (04/26 0623) Resp:  [17-25] 20 (04/26 0623) BP: (98-130)/(62-85) 130/62 mmHg (04/26 0623) SpO2:  [94 %-100 %] 100 % (04/26 0623)  Intake/Output from previous day: 04/25 0701 - 04/26 0700 In: 2677.5 [P.O.:720; I.V.:1957.5] Out: 325 [Urine:275; Blood:50] Intake/Output this shift:     Recent Labs  02/26/13 0515  HGB 12.1*    Recent Labs  02/26/13 0515  HCT 34.3*    Recent Labs  02/26/13 0515  NA 138  K 4.2  CL 105  CO2 24  BUN 17  CREATININE 1.09  GLUCOSE 169*  CALCIUM 8.9   No results found for this basename: LABPT, INR,  in the last 72 hours  Neuropraxia involving Left Upper.  Assessment/Plan: 1 Day Post-Op Procedure(s) (LRB): LEFT TOTAL SHOULDER ARTHROPLASTY  (Left) Up with therapy  Norvella Loscalzo A 02/26/2013, 8:18 AM

## 2013-02-26 NOTE — Progress Notes (Signed)
Orthopedic Tech Progress Note Patient Details:  Jimmy Frazier 21-Oct-1951 [id number] Spoke with ordering doctor; doctor stated she wanted velcro wrist splint for Left wrist. Splint applied to patient in room. Tolerated well.  Ortho Devices Type of Ortho Device: Velcro wrist splint Ortho Device/Splint Location: Left Ortho Device/Splint Interventions: Application   Asia R Thompson 02/26/2013, 8:47 AM

## 2013-02-26 NOTE — Evaluation (Signed)
Occupational Therapy Evaluation Patient Details Name: Jimmy Frazier MRN: [id number] DOB: 16-Jul-1951 Today's Date: 02/26/2013 Time: 4098-1191 OT Time Calculation (min): 39 min  OT Assessment / Plan / Recommendation Clinical Impression  Pt is doing well POD 1 from L TSA.  Instructed in pendulums, ADL using hemitechniques, sling use, positioning in bed and chair.  Pt has weakness in L elbow.  Needs instruction in supine exercises per Dr. Ranell Patrick' protocol.    OT Assessment  Patient needs continued OT Services    Follow Up Recommendations  Supervision - Intermittent (progress at MD recommendation)    Barriers to Discharge      Equipment Recommendations  None recommended by OT    Recommendations for Other Services    Frequency  Min 2X/week    Precautions / Restrictions Precautions Precautions: Shoulder Type of Shoulder Precautions: NWB, Dr. Ranell Patrick protocol for TSA Precaution Booklet Issued: Yes (comment) Restrictions Weight Bearing Restrictions: Yes LUE Weight Bearing: Non weight bearing   Pertinent Vitals/Pain No c/o pain.    ADL  Eating/Feeding: Set up Where Assessed - Eating/Feeding: Chair Grooming: Independent;Wash/dry hands;Wash/dry face;Teeth care Where Assessed - Grooming: Unsupported standing Upper Body Bathing: Minimal assistance Where Assessed - Upper Body Bathing: Unsupported standing Lower Body Bathing: Minimal assistance Where Assessed - Lower Body Bathing: Unsupported standing Upper Body Dressing: Minimal assistance Where Assessed - Upper Body Dressing: Unsupported standing Lower Body Dressing: Minimal assistance Where Assessed - Lower Body Dressing: Unsupported sitting;Supported sit to stand Toilet Transfer: Independent Toilet Transfer Method: Sit to Barista: Comfort height toilet Toileting - Clothing Manipulation and Hygiene: Modified independent Where Assessed - Toileting Clothing Manipulation and Hygiene:  Standing Transfers/Ambulation Related to ADLs: independent, pushes IV pole ADL Comments: Instructed in hemitechniques for ADL. Sling use.    OT Diagnosis: Generalized weakness;Acute pain  OT Problem List: Decreased strength;Decreased range of motion;Impaired UE functional use;Pain OT Treatment Interventions: Self-care/ADL training;Therapeutic exercise;Therapeutic activities;Patient/family education   OT Goals Acute Rehab OT Goals OT Goal Formulation: With patient Time For Goal Achievement: 03/05/13 Potential to Achieve Goals: Good Arm Goals Pt Will Perform AROM: Independently;Left upper extremity;1 set;10 reps (L elbow to hand) Arm Goal: AROM - Progress: Goal set today Miscellaneous OT Goals Miscellaneous OT Goal #1: Pt will be independent in pendulum exercises and AAROM exercises in supine as per MD protocol. OT Goal: Miscellaneous Goal #1 - Progress: Goal set today Miscellaneous OT Goal #2: Pt and wife will be independent in donning and doffing sling and verbalize wearing schedule. OT Goal: Miscellaneous Goal #2 - Progress: Goal set today Miscellaneous OT Goal #3: Pt will verbalize knowledge in hemitechniques for bathing and dressing. OT Goal: Miscellaneous Goal #3 - Progress: Goal set today  Visit Information  Last OT Received On: 02/26/13 Assistance Needed: +1    Subjective Data  Subjective: "I'm retired from [employer]." Patient Stated Goal: Home with wife, return to PLOF.   Prior Functioning     Home Living Lives With: Spouse Available Help at Discharge: Family;Available PRN/intermittently Type of Home: House Home Layout: One level Bathroom Shower/Tub: Engineer, manufacturing systems: Standard Home Adaptive Equipment: None Prior Function Level of Independence: Independent Able to Take Stairs?: Yes Driving: Yes Vocation: Retired Musician: No difficulties Dominant Hand: Right         Vision/Perception Vision - History Baseline Vision: Wears  glasses all the time Patient Visual Report: No change from baseline   Cognition  Cognition Arousal/Alertness: Awake/alert Behavior During Therapy: WFL for tasks assessed/performed Overall Cognitive Status: Within Functional  Limits for tasks assessed    Extremity/Trunk Assessment Right Upper Extremity Assessment RUE ROM/Strength/Tone: Within functional levels Left Upper Extremity Assessment LUE ROM/Strength/Tone: Deficits LUE ROM/Strength/Tone Deficits: Full AROM forearm/wrist/fingers, 2/5 elbow, shoulder not tested (Dr Darrelyn Hillock ordered wrist cock up splint) LUE Sensation:  (intact temp and pain) Trunk Assessment Trunk Assessment: Normal     Mobility Bed Mobility Bed Mobility: Not assessed (instructed to avoid use of L UE to push up) Transfers Transfers: Sit to Stand;Stand to Sit Sit to Stand: 7: Independent;With armrests;From chair/3-in-1;From toilet Stand to Sit: 7: Independent;With upper extremity assist;To chair/3-in-1;To toilet     Exercise Shoulder Exercises Pendulum Exercise: Left;20 reps;Standing Elbow Flexion: AAROM;Left;10 reps;Standing Elbow Extension: AAROM;10 reps;Standing;Left Wrist Flexion: AROM;10 reps;Standing;Left Wrist Extension: AROM;Left;10 reps;Standing Digit Composite Flexion: AROM;10 reps;Standing;Left Composite Extension: AROM;10 reps;Standing;Left Donning/doffing shirt without moving shoulder: Minimal assistance Method for sponge bathing under operated UE: Minimal assistance Donning/doffing sling/immobilizer: Minimal assistance Correct positioning of sling/immobilizer: Set-up Pendulum exercises (written home exercise program): Supervision/safety ROM for elbow, wrist and digits of operated UE: Minimal assistance Sling wearing schedule (on at all times/off for ADL's): Independent Proper positioning of operated UE when showering: Supervision/safety Positioning of UE while sleeping: Independent   Balance     End of Session OT - End of  Session Activity Tolerance: Patient tolerated treatment well Patient left: in chair;with call bell/phone within reach;with family/visitor present  GO     Evern Bio 02/26/2013, 11:12 AM [phone]

## 2013-02-26 NOTE — Op Note (Signed)
NAMEBRIGGS, Jimmy NO.:  Frazier  MEDICAL RECORD NO.:  [id number]  LOCATION:  [id number]                        FACILITY:  [facility]  PHYSICIAN:  Almedia Balls. Ranell Patrick, M.D. DATE OF BIRTH:  August 12, 1951  DATE OF PROCEDURE:  02/25/2013 DATE OF DISCHARGE:                              OPERATIVE REPORT   PREOPERATIVE DIAGNOSIS:  Left shoulder end-stage osteoarthritis.  POSTOPERATIVE DIAGNOSIS:  Left shoulder end-stage osteoarthritis.  PROCEDURE PERFORMED:  Left total shoulder arthroplasty using DePuy global advantage system.  ATTENDING SURGEON:  Almedia Balls. Ranell Patrick, M.D.  ASSISTANT:  Donnie Coffin. Dixon, PA-C, who was scrubbed during the entire procedure and necessary for satisfactory completion of surgery.  ANESTHESIA:  General anesthesia was used plus interscalene block.  ESTIMATED BLOOD LOSS:  100 mL.  FLUID REPLACEMENT:  1500 mL crystalloids.  INSTRUMENT COUNTS:  Correct.  COMPLICATIONS:  There were no complications.  ANTIBIOTICS:  Perioperative antibiotics were given.  INDICATIONS:  The patient is a 62 year old male who suffered from long- standing left shoulder arthritis.  The patient's pain has progressed to the point, where he is now having pain at night and pain at rest.  The patient desires total shoulder arthroplasty.  Risks and benefits of surgery were discussed.  Informed consent obtained.  DESCRIPTION OF PROCEDURE:  After an adequate level of anesthesia achieved, the patient was positioned in the modified beach-chair position.  Left shoulder correctly identified, sterilely prepped and draped in the usual manner.  Time-out called.  We irrigated the patient's shoulder using standard deltopectoral incision.  This was started with coracoid process extending down to the anterior humeral shaft.  Dissection down through subcutaneous tissues using the Bovie. We identified the cephalic vein, took it laterally with the deltoid. The pectoralis was taken medially.   Conjoined tendon identified and retracted medially.  The patient's shoulder was subluxed posteriorly, making exposure difficult.  We released the subscapularis which was in good condition off the lesser tuberosity subperiosteally.  We placed #2 hi-fi suture, a modified Mason-Allen suture technique in the free end of the tendon, removed the anterior capsule off the back of the subscapularis.  Biceps tendon was tenotomized.  Once that was accomplished, we went ahead and released the inferior capsule, progressively externally rotated the humerus.  There were large spurs noted off the humeral side.  We then took our neck resection guide and set it on about 10 degrees of retroversion due to the patient's extreme retroversion on his glenoid and we went ahead and resected the humerus, humeral head.  We took the head to the back table, removed the bone graft for later in the procedure.  We then went ahead and progressively externally rotated, placed a Brown retractor, and then reamed the humerus to a size 12 diameter.  We then went ahead and broached for a size 12 Global Advantage stem with the again about 10 degrees of retroversion total on the prosthesis and the cut.  The anterior fin was adjacent to the biceps groove.  Next, we went ahead and retracted the humerus posteriorly, again quite difficult exposure due to the extreme retroversion, we removed the labrum and the capsule protecting the axillary nerve, which was palpated and  visible.  We then went ahead and used a bur anteriorly to bur down the anterior glenoid, so that we could ream.  We found our center point which we had to compromise a little bit truly correcting all of the retroversion.  We created much to narrow of the glenoid, best corrected about half of it and then placed our drill guide or reamer over the top of that guide pin.  We were pleased with the resection.  We want up resecting probably half a cm bone deep anteriorly and  just getting flushed posteriorly, but we were pleased again with being able to create a nice bed for the glenoid component. We next went ahead and drilled out our central post for the anchor PEG glenoid prosthesis from DePuy.  We then placed our guide for drilling our peripheral 3 holes which will be cemented.  We drilled our superior one first and the anterior inferior, posterior inferior.  We then went ahead and dried the peripheral holes with epi soaked Gelfoam, and then mixed vacuum mixed cement on the back table DePuy 1 cement and then used a small introducer for the cemented and reducing that under pressure on the superior anterior inferior posterior inferior holes.  We took our anchor PEG glenoid, impacted it in position, held that until all cement was hard in the table about 12-13 minutes for this DePuy 1.  We then removed our retractors.  We are pleased with the component stability, externally rotated, placed drill holes in our humerus for suture to repair the subscapularis.  We went ahead and using a available bone graft in an impaction grafting technique, impacted the size 12 Global Advantage stem into position.  We then placed trial heads to see what would be the best coverage.  It looked like the 44 x 18 eccentric was the best coverage and gave good stability upon reduction.  At this point, we went ahead and thoroughly irrigated the shoulder, removed our trial head, impacted a 44 x 18 head in position and then anatomic repair of the subscapularis back to the rotator interval into the lesser tuberosity.  Once that was achieved, we closed the deltopectoral interval, again irrigating with 0 Vicryl suture, followed by 2-0 Vicryl subcutaneous closure and 4-0 Monocryl for skin.  Steri-Strips were applied, followed by sterile dressing.  The patient tolerated the procedure well.     Almedia Balls. Ranell Patrick, M.D.     SRN/MEDQ  D:  02/25/2013  T:  02/26/2013  Job:  [id number]

## 2013-02-27 NOTE — Discharge Summary (Signed)
Physician Discharge Summary   Patient ID: Jimmy Frazier MRN: [id number] DOB/AGE: 60-Jun-1952 60 y.o.  Admit date: 02/25/2013 Discharge date: 02/27/2013  Admission Diagnoses:  Left shoulder severe osteoarthritis  Discharge Diagnoses:  Same   Surgeries: Procedure(s): LEFT TOTAL SHOULDER ARTHROPLASTY  on 02/25/2013   Consultants: OT  Discharged Condition: Stable  Hospital Course: Jimmy Frazier is an 62 y.o. male who was admitted 02/25/2013 with a chief complaint of severe left shoulder pain and loss of function, and found to have a diagnosis of left shoulder osteoarthritis.  They were brought to the operating room on 02/25/2013 and underwent the above named procedures.    The patient had an uncomplicated hospital course and was stable for discharge.  Recent vital signs:  Filed Vitals:   02/27/13 0704  BP: 112/66  Pulse: 77  Temp: 97.9 F (36.6 C)  Resp: 16    Recent laboratory studies:  Results for orders placed during the hospital encounter of 02/25/13  GLUCOSE, CAPILLARY      Result Value Range   Glucose-Capillary 116 (*) 70 - 99 mg/dL  GLUCOSE, CAPILLARY      Result Value Range   Glucose-Capillary 167 (*) 70 - 99 mg/dL  BASIC METABOLIC PANEL      Result Value Range   Sodium 138  135 - 145 mEq/L   Potassium 4.2  3.5 - 5.1 mEq/L   Chloride 105  96 - 112 mEq/L   CO2 24  19 - 32 mEq/L   Glucose, Bld 169 (*) 70 - 99 mg/dL   BUN 17  6 - 23 mg/dL   Creatinine, Ser 4.09  0.50 - 1.35 mg/dL   Calcium 8.9  8.4 - 81.1 mg/dL   GFR calc non Af Amer 72 (*) >90 mL/min   GFR calc Af Amer 84 (*) >90 mL/min  HEMOGLOBIN AND HEMATOCRIT, BLOOD      Result Value Range   Hemoglobin 12.1 (*) 13.0 - 17.0 g/dL   HCT 91.4 (*) 78.2 - 95.6 %  GLUCOSE, CAPILLARY      Result Value Range   Glucose-Capillary 154 (*) 70 - 99 mg/dL   Comment 1 Notify RN     Comment 2 Documented in Chart      Discharge Medications:     Medication List    STOP taking these medications       ibuprofen  200 MG tablet  Commonly known as:  ADVIL,MOTRIN      TAKE these medications       aspirin 81 MG tablet  Take 81 mg by mouth daily.     diclofenac sodium 1 % Gel  Commonly known as:  VOLTAREN  Apply 4 g topically 4 (four) times daily as needed (for joint pain).     felodipine 10 MG 24 hr tablet  Commonly known as:  PLENDIL  Take 10 mg by mouth daily.     lisinopril-hydrochlorothiazide 20-12.5 MG per tablet  Commonly known as:  PRINZIDE,ZESTORETIC  Take 1 tablet by mouth daily.     methocarbamol 500 MG tablet  Commonly known as:  ROBAXIN  Take 500 mg by mouth 4 (four) times daily as needed (for muscle pain).     omeprazole 20 MG capsule  Commonly known as:  PRILOSEC  Take 20 mg by mouth daily.     oxyCODONE-acetaminophen 5-325 MG per tablet  Commonly known as:  ROXICET  Take 1 tablet by mouth every 4 (four) hours as needed for pain.  Diagnostic Studies: Dg Chest 2 View  02/18/2013  *RADIOLOGY REPORT*  Clinical Data: Preoperative respiratory examination for left shoulder arthroplasty.  History of hypertension and diabetes.  CHEST - 2 VIEW  Comparison: None.  Findings: There are low lung volumes with mild resulting bibasilar atelectasis.  Slight blunting of the left costophrenic angle on the frontal examination is probably due to atelectasis and adjacent pleural thickening.  There is no significant pleural effusion on the lateral view.  The heart size and mediastinal contours are normal for the degree of inspiration.  There is a thoracolumbar scoliosis.  Glenohumeral degenerative changes are present on the left.  The left glenoid fossa appears somewhat hypoplastic.  IMPRESSION: Suboptimal inspiration with bibasilar atelectasis.  No acute findings identified.   Original Report Authenticated By: Carey Bullocks, M.D.    Dg Shoulder Left Port  02/25/2013  *RADIOLOGY REPORT*  Clinical Data: Left shoulder prosthesis.  PORTABLE LEFT SHOULDER - 2+ VIEW  Comparison: None.  Findings:  The left proximal humeral hemiarthroplasty is in place without fracture or complicating feature.  Gas is present projecting below the acromion and tracking along the soft tissues of the upper arm.  The glenoid has a somewhat flattened appearance, possibly, probably from arthropathy.  IMPRESSION:  1.  Left proximal humeral hemiarthroplasty in place without complicating feature.   Original Report Authenticated By: Gaylyn Rong, M.D.     Disposition: home        Follow-up Information   Follow up with Breylin Dom,STEVEN R, MD. Call in 2 weeks. [phone])    Contact information:   [address], [address] [place] [address] [phone]        Signed: Verlee Rossetti 02/27/2013, 8:51 AM

## 2013-02-27 NOTE — Progress Notes (Signed)
Orthopedics Progress Note  Subjective: That block wore off and I can use my arm again.  I am ready to go home. Objective:  Filed Vitals:   02/27/13 0704  BP: 112/66  Pulse: 77  Temp: 97.9 F (36.6 C)  Resp: 16    General: Awake and alert  Musculoskeletal: left shoulder incision looking great, no erythema, no drainage Neurovascularly intact  Lab Results  Component Value Date   WBC 5.6 02/18/2013   HGB 12.1* 02/26/2013   HCT 34.3* 02/26/2013   MCV 80.1 02/18/2013   PLT 219 02/18/2013       Component Value Date/Time   NA 138 02/26/2013 0515   K 4.2 02/26/2013 0515   CL 105 02/26/2013 0515   CO2 24 02/26/2013 0515   GLUCOSE 169* 02/26/2013 0515   BUN 17 02/26/2013 0515   CREATININE 1.09 02/26/2013 0515   CALCIUM 8.9 02/26/2013 0515   GFRNONAA 72* 02/26/2013 0515   GFRAA 84* 02/26/2013 0515    Lab Results  Component Value Date   INR 1.03 02/18/2013    Assessment/Plan: POD #2 s/p Procedure(s): LEFT TOTAL SHOULDER ARTHROPLASTY  Block resolved, neuro function of the left arm is normal this morning Rehearsed the exercises that he will do at home with his wife present Stable for discharge this morning Follow up in two weeks  Almedia Balls. Ranell Patrick, MD 02/27/2013 8:49 AM

## 2013-02-27 NOTE — Progress Notes (Signed)
Patient ready for discharge home.  Patient states that physician has given hard copies of scripts to patient's spouse.  Spouse in agreement.  Patient is alert and oriented x4.  Gait steady.  No S/O discomfort.  All belongings packed by patient and spouse.  VSS.  Discharged to home.   Kelli Hope M

## 2013-02-28 ENCOUNTER — Encounter (HOSPITAL_COMMUNITY): Payer: Self-pay | Admitting: Orthopedic Surgery

## 2013-03-01 NOTE — Progress Notes (Signed)
   CARE MANAGEMENT NOTE 03/01/2013  Patient:  Jimmy Frazier, Jimmy Frazier   Account Number:  [id number]  Date Initiated:  03/01/2013  Documentation initiated by:  [facility]  Subjective/Objective Assessment:   LEFT TOTAL SHOULDER ARTHROPLASTY  on 02/25/2013     Action/Plan:   HH   Anticipated DC Date:  02/27/2013   Anticipated DC Plan:  HOME W HOME HEALTH SERVICES      DC Planning Services  CM consult      Choice offered to / List presented to:          [facility] arranged  HH-2 PT  HH-3 OT      [facility] agency  [facility]   Status of service:  Completed, signed off Medicare Important Message given?   (If response is "NO", the following Medicare IM given date fields will be blank) Date Medicare IM given:   Date Additional Medicare IM given:    Discharge Disposition:  HOME W HOME HEALTH SERVICES  Per UR Regulation:    If discussed at Long Length of Stay Meetings, dates discussed:    Comments:  03/01/2013 1145 NCM did follow up with [facility] for referral for [facility]. Pt was preoperatively set up with agency for [facility]. Isidoro Donning RN CCM Case Mgmt phone [phone]

## 2015-11-27 DIAGNOSIS — M25561 Pain in right knee: Secondary | ICD-10-CM | POA: Diagnosis not present

## 2015-11-27 DIAGNOSIS — M25562 Pain in left knee: Secondary | ICD-10-CM | POA: Diagnosis not present

## 2015-12-26 DIAGNOSIS — M170 Bilateral primary osteoarthritis of knee: Secondary | ICD-10-CM | POA: Diagnosis not present

## 2015-12-26 DIAGNOSIS — M25561 Pain in right knee: Secondary | ICD-10-CM | POA: Diagnosis not present

## 2016-02-01 NOTE — Patient Instructions (Signed)
Jimmy Frazier  02/01/2016   Your procedure is scheduled on:   Report to [facility] Main  Entrance take [facility]  elevators to 3rd floor to  [place] at AM.  Call this number if you have problems the morning of surgery [phone]   Remember: ONLY 1 PERSON MAY GO WITH YOU TO SHORT STAY TO GET  READY MORNING OF [place].  Do not eat food or drink liquids :After Midnight.     Take these medicines the morning of surgery with A SIP OF WATER:  DO NOT TAKE ANY DIABETIC MEDICATIONS DAY OF YOUR SURGERY                               You may not have any metal on your body including hair pins and              piercings  Do not wear jewelry, make-up, lotions, powders or perfumes, deodorant             Do not wear nail polish.  Do not shave  48 hours prior to surgery.              Men may shave face and neck.   Do not bring valuables to the hospital. [place].  Contacts, dentures or bridgework may not be worn into surgery.  Leave suitcase in the car. After surgery it may be brought to your room.     Patients discharged the day of surgery will not be allowed to drive home.  Name and phone number of your driver:  Special Instructions: N/A              Please read over the following fact sheets you were given: _____________________________________________________________________             [facility] [place] - Preparing for Surgery Before surgery, you can play an important role.  Because skin is not sterile, your skin needs to be as free of germs as possible.  You can reduce the number of germs on your skin by washing with CHG (chlorahexidine gluconate) soap before surgery.  CHG is an antiseptic cleaner which kills germs and bonds with the skin to continue killing germs even after washing. Please DO NOT use if you have an allergy to CHG or antibacterial soaps.  If your skin becomes reddened/irritated stop using the  CHG and inform your nurse when you arrive at Short Stay. Do not shave (including legs and underarms) for at least 48 hours prior to the first CHG shower.  You may shave your face/neck. Please follow these instructions carefully:  1.  Shower with CHG Soap the night before surgery and the  morning of Surgery.  2.  If you choose to wash your hair, wash your hair first as usual with your  normal  shampoo.  3.  After you shampoo, rinse your hair and body thoroughly to remove the  shampoo.                           4.  Use CHG as you would any other liquid soap.  You can apply chg directly  to the skin and wash  Gently with a scrungie or clean washcloth.  5.  Apply the CHG Soap to your body ONLY FROM THE NECK DOWN.   Do not use on face/ open                           Wound or open sores. Avoid contact with eyes, ears mouth and genitals (private parts).                       Wash face,  Genitals (private parts) with your normal soap.             6.  Wash thoroughly, paying special attention to the area where your surgery  will be performed.  7.  Thoroughly rinse your body with warm water from the neck down.  8.  DO NOT shower/wash with your normal soap after using and rinsing off  the CHG Soap.                9.  Pat yourself dry with a clean towel.            10.  Wear clean pajamas.            11.  Place clean sheets on your bed the night of your first shower and do not  sleep with pets. Day of Surgery : Do not apply any lotions/deodorants the morning of surgery.  Please wear clean clothes to the hospital/surgery center.  FAILURE TO FOLLOW THESE INSTRUCTIONS MAY RESULT IN THE CANCELLATION OF YOUR SURGERY PATIENT SIGNATURE_________________________________  NURSE SIGNATURE__________________________________  ________________________________________________________________________   [name]  An incentive spirometer is a tool that can help keep your lungs clear  and active. This tool measures how well you are filling your lungs with each breath. Taking long deep breaths may help reverse or decrease the chance of developing breathing (pulmonary) problems (especially infection) following:  A long period of time when you are unable to move or be active. BEFORE THE PROCEDURE   If the spirometer includes an indicator to show your best effort, your nurse or respiratory therapist will set it to a desired goal.  If possible, sit up straight or lean slightly forward. Try not to slouch.  Hold the incentive spirometer in an upright position. INSTRUCTIONS FOR USE   Sit on the edge of your bed if possible, or sit up as far as you can in bed or on a chair.  Hold the incentive spirometer in an upright position.  Breathe out normally.  Place the mouthpiece in your mouth and seal your lips tightly around it.  Breathe in slowly and as deeply as possible, raising the piston or the ball toward the top of the column.  Hold your breath for 3-5 seconds or for as long as possible. Allow the piston or ball to fall to the bottom of the column.  Remove the mouthpiece from your mouth and breathe out normally.  Rest for a few seconds and repeat Steps 1 through 7 at least 10 times every 1-2 hours when you are awake. Take your time and take a few normal breaths between deep breaths.  The spirometer may include an indicator to show your best effort. Use the indicator as a goal to work toward during each repetition.  After each set of 10 deep breaths, practice coughing to be sure your lungs are clear. If you have an incision (the cut made at the time of surgery),  support your incision when coughing by placing a pillow or rolled up towels firmly against it. Once you are able to get out of bed, walk around indoors and cough well. You may stop using the incentive spirometer when instructed by your caregiver.  RISKS AND COMPLICATIONS  Take your time so you do not get dizzy or  light-headed.  If you are in pain, you may need to take or ask for pain medication before doing incentive spirometry. It is harder to take a deep breath if you are having pain. AFTER USE  Rest and breathe slowly and easily.  It can be helpful to keep track of a log of your progress. Your caregiver can provide you with a simple table to help with this. If you are using the spirometer at home, follow these instructions: [place] IF:   You are having difficultly using the spirometer.  You have trouble using the spirometer as often as instructed.  Your pain medication is not giving enough relief while using the spirometer.  You develop fever of 100.5 F (38.1 C) or higher. SEEK IMMEDIATE MEDICAL CARE IF:   You cough up bloody sputum that had not been present before.  You develop fever of 102 F (38.9 C) or greater.  You develop worsening pain at or near the incision site. MAKE SURE YOU:   Understand these instructions.  Will watch your condition.  Will get help right away if you are not doing well or get worse. Document Released: 03/02/2007 Document Revised: 01/12/2012 Document Reviewed: 05/03/2007 Exit[employer] Patient Information 2014 [employer], [place].   ________________________________________________________________________  WHAT IS A BLOOD TRANSFUSION? Blood Transfusion Information  A transfusion is the replacement of blood or some of its parts. Blood is made up of multiple cells which provide different functions.  Red blood cells carry oxygen and are used for blood loss replacement.  White blood cells fight against infection.  Platelets control bleeding.  Plasma helps clot blood.  Other blood products are available for specialized needs, such as hemophilia or other clotting disorders. BEFORE THE TRANSFUSION  Who gives blood for transfusions?   Healthy volunteers who are fully evaluated to make sure their blood is safe. This is blood bank  blood. Transfusion therapy is the safest it has ever been in the practice of medicine. Before blood is taken from a donor, a complete history is taken to make sure that person has no history of diseases nor engages in risky social behavior (examples are intravenous drug use or sexual activity with multiple partners). The donor's travel history is screened to minimize risk of transmitting infections, such as malaria. The donated blood is tested for signs of infectious diseases, such as HIV and hepatitis. The blood is then tested to be sure it is compatible with you in order to minimize the chance of a transfusion reaction. If you or a relative donates blood, this is often done in anticipation of surgery and is not appropriate for emergency situations. It takes many days to process the donated blood. RISKS AND COMPLICATIONS Although transfusion therapy is very safe and saves many lives, the main dangers of transfusion include:   Getting an infectious disease.  Developing a transfusion reaction. This is an allergic reaction to something in the blood you were given. Every precaution is taken to prevent this. The decision to have a blood transfusion has been considered carefully by your caregiver before blood is given. Blood is not given unless the benefits outweigh the risks. AFTER THE TRANSFUSION  Right after receiving a blood transfusion, you will usually feel much better and more energetic. This is especially true if your red blood cells have gotten low (anemic). The transfusion raises the level of the red blood cells which carry oxygen, and this usually causes an energy increase.  The nurse administering the transfusion will monitor you carefully for complications. HOME CARE INSTRUCTIONS  No special instructions are needed after a transfusion. You may find your energy is better. Speak with your caregiver about any limitations on activity for underlying diseases you may have. SEEK MEDICAL CARE IF:    Your condition is not improving after your transfusion.  You develop redness or irritation at the intravenous (IV) site. SEEK IMMEDIATE MEDICAL CARE IF:  Any of the following symptoms occur over the next 12 hours:  Shaking chills.  You have a temperature by mouth above 102 F (38.9 C), not controlled by medicine.  Chest, back, or muscle pain.  People around you feel you are not acting correctly or are confused.  Shortness of breath or difficulty breathing.  Dizziness and fainting.  You get a rash or develop hives.  You have a decrease in urine output.  Your urine turns a dark color or changes to pink, red, or brown. Any of the following symptoms occur over the next 10 days:  You have a temperature by mouth above 102 F (38.9 C), not controlled by medicine.  Shortness of breath.  Weakness after normal activity.  The white part of the eye turns yellow (jaundice).  You have a decrease in the amount of urine or are urinating less often.  Your urine turns a dark color or changes to pink, red, or brown. Document Released: 10/17/2000 Document Revised: 01/12/2012 Document Reviewed: 06/05/2008 [facility] Patient Information 2014 [place], [place].  _______________________________________________________________________

## 2016-02-01 NOTE — H&P (Signed)
TOTAL KNEE ADMISSION H&P  Patient is being admitted for right total knee arthroplasty.  Subjective:  Chief Complaint:    Right knee primary OA / pain  HPI: Jimmy Frazier, 65 y.o. male, has a history of pain and functional disability in the right knee due to arthritis and has failed non-surgical conservative treatments for greater than 12 weeks to include NSAID's and/or analgesics, corticosteriod injections, viscosupplementation injections and activity modification.  Onset of symptoms was gradual, starting >10 years ago with gradually worsening course since that time. The patient noted prior procedures on the knee to include  arthroscopy on the right knee(s).  Patient currently rates pain in the right knee(s) at 10 out of 10 with activity. Patient has night pain, worsening of pain with activity and weight bearing, pain that interferes with activities of daily living, pain with passive range of motion, crepitus and joint swelling.  Patient has evidence of periarticular osteophytes and joint space narrowing by imaging studies.  There is no active infection.   Risks, benefits and expectations were discussed with the patient.  Risks including but not limited to the risk of anesthesia, blood clots, nerve damage, blood vessel damage, failure of the prosthesis, infection and up to and including death.  Patient understand the risks, benefits and expectations and wishes to proceed with surgery.   PCP: Wonda Cerise, MD  D/C Plans:      Home  Post-op Meds:       No Rx given  Tranexamic Acid:      To be given - IV   Decadron:      Is to be given  FYI:     ASA  Norco    Past Medical History  Diagnosis Date  . Hypertension     takes Prinizide and Felodipine daily  . Hyperlipidemia     but doesn't require meds  . Headache(784.0)     hx of cluster HA;but none in a while  . Muscle spasm     Robaxin daily prn  . Back pain     occasionally  . Joint pain   . Joint swelling   . Arthritis   .  GERD (gastroesophageal reflux disease)     takes Omeprazole daily prn  . Diverticulosis   . Diabetes mellitus without complication     borderline;but no meds required yet;diet and exercise    Past Surgical History  Procedure Laterality Date  . Knee arthroscopy      2 on right and 1 on left  . Right shoulder arthroscopy    . Esophagogastroduodenoscopy    . Colonoscopy    . Total shoulder arthroplasty Left 02/25/2013    Procedure: LEFT TOTAL SHOULDER ARTHROPLASTY ;  Surgeon: Augustin Schooling, MD;  Location: [facility];  Service: Orthopedics;  Laterality: Left;    No prescriptions prior to admission   No Known Allergies   Social History  Substance Use Topics  . Smoking status: Never Smoker   . Smokeless tobacco: Not on file  . Alcohol Use: Yes     Comment: rarely       Review of Systems  Constitutional: Negative.   Eyes: Negative.   Respiratory: Negative.   Cardiovascular: Negative.   Gastrointestinal: Positive for heartburn.  Genitourinary: Positive for frequency.  Musculoskeletal: Positive for back pain and joint pain.  Skin: Negative.   Neurological: Positive for headaches.  Endo/Heme/Allergies: Negative.   Psychiatric/Behavioral: Negative.     Objective:  Physical Exam  Constitutional: He is oriented  to person, place, and time. He appears well-developed.  HENT:  Head: Normocephalic.  Eyes: Pupils are equal, round, and reactive to light.  Neck: Neck supple. No JVD present. No tracheal deviation present. No thyromegaly present.  Cardiovascular: Normal rate, regular rhythm, normal heart sounds and intact distal pulses.   Respiratory: Effort normal and breath sounds normal. No stridor. No respiratory distress. He has no wheezes.  GI: Soft. There is no tenderness. There is no guarding.  Musculoskeletal:       Right knee: He exhibits decreased range of motion, swelling and bony tenderness. He exhibits no ecchymosis, no deformity, no laceration and no erythema. Tenderness  found.  Lymphadenopathy:    He has no cervical adenopathy.  Neurological: He is alert and oriented to person, place, and time.  Skin: Skin is warm and dry.  Psychiatric: He has a normal mood and affect.      Labs:  Estimated body mass index is 32.01 kg/(m^2) as calculated from the following:   Height as of 02/18/13: 5\' 10"  (1.778 m).   Weight as of 02/18/13: 101.2 kg (223 lb 1.7 oz).   Imaging Review Plain radiographs demonstrate severe degenerative joint disease of the right knee(s).  The bone quality appears to be good for age and reported activity level.  Assessment/Plan:  End stage arthritis, right knee   The patient history, physical examination, clinical judgment of the provider and imaging studies are consistent with end stage degenerative joint disease of the right knee(s) and total knee arthroplasty is deemed medically necessary. The treatment options including medical management, injection therapy arthroscopy and arthroplasty were discussed at length. The risks and benefits of total knee arthroplasty were presented and reviewed. The risks due to aseptic loosening, infection, stiffness, patella tracking problems, thromboembolic complications and other imponderables were discussed. The patient acknowledged the explanation, agreed to proceed with the plan and consent was signed. Patient is being admitted for inpatient treatment for surgery, pain control, PT, OT, prophylactic antibiotics, VTE prophylaxis, progressive ambulation and ADL's and discharge planning. The patient is planning to be discharged home with home health services.      [place] P[place] Ashanti Littles   PA-C  02/01/2016, 7:51 AM

## 2016-02-04 ENCOUNTER — Encounter (HOSPITAL_COMMUNITY)
Admission: RE | Admit: 2016-02-04 | Discharge: 2016-02-04 | Disposition: A | Discharge status: Home / Self Care | Payer: Self-pay | Source: Ambulatory Visit | Attending: Orthopedic Surgery | Admitting: Orthopedic Surgery

## 2016-02-05 ENCOUNTER — Other Ambulatory Visit: Payer: Self-pay

## 2016-02-05 ENCOUNTER — Encounter (HOSPITAL_COMMUNITY): Payer: Self-pay

## 2016-02-05 ENCOUNTER — Encounter (HOSPITAL_COMMUNITY)
Admission: RE | Admit: 2016-02-05 | Discharge: 2016-02-05 | Disposition: A | Discharge status: Home / Self Care | Payer: PPO | Source: Ambulatory Visit | Attending: Orthopedic Surgery | Admitting: Orthopedic Surgery

## 2016-02-05 DIAGNOSIS — I10 Essential (primary) hypertension: Secondary | ICD-10-CM | POA: Diagnosis not present

## 2016-02-05 DIAGNOSIS — Z0183 Encounter for blood typing: Secondary | ICD-10-CM | POA: Diagnosis not present

## 2016-02-05 DIAGNOSIS — Z01812 Encounter for preprocedural laboratory examination: Secondary | ICD-10-CM | POA: Insufficient documentation

## 2016-02-05 DIAGNOSIS — E119 Type 2 diabetes mellitus without complications: Secondary | ICD-10-CM | POA: Insufficient documentation

## 2016-02-05 DIAGNOSIS — Z01818 Encounter for other preprocedural examination: Secondary | ICD-10-CM | POA: Insufficient documentation

## 2016-02-05 DIAGNOSIS — M1712 Unilateral primary osteoarthritis, left knee: Secondary | ICD-10-CM | POA: Insufficient documentation

## 2016-02-05 LAB — BASIC METABOLIC PANEL
Anion gap: 9 (ref 5–15)
BUN: 26 mg/dL — ABNORMAL HIGH (ref 6–20)
CALCIUM: 9.4 mg/dL (ref 8.9–10.3)
CO2: 25 mmol/L (ref 22–32)
CREATININE: 1.32 mg/dL — AB (ref 0.61–1.24)
Chloride: 107 mmol/L (ref 101–111)
GFR calc non Af Amer: 56 mL/min — ABNORMAL LOW (ref >60)
Glucose, Bld: 133 mg/dL — ABNORMAL HIGH (ref 65–99)
Potassium: 4.6 mmol/L (ref 3.5–5.1)
SODIUM: 141 mmol/L (ref 135–145)

## 2016-02-05 LAB — CBC
HCT: 41.5 % (ref 39.0–52.0)
Hemoglobin: 14.3 g/dL (ref 13.0–17.0)
MCH: 28.8 pg (ref 26.0–34.0)
MCHC: 34.5 g/dL (ref 30.0–36.0)
MCV: 83.5 fL (ref 78.0–100.0)
PLATELETS: 211 10*3/uL (ref 150–400)
RBC: 4.97 MIL/uL (ref 4.22–5.81)
RDW: 13.7 % (ref 11.5–15.5)
WBC: 6.7 10*3/uL (ref 4.0–10.5)

## 2016-02-05 LAB — URINALYSIS, ROUTINE W REFLEX MICROSCOPIC
BILIRUBIN URINE: NEGATIVE
GLUCOSE, UA: NEGATIVE mg/dL
Ketones, ur: NEGATIVE mg/dL
Nitrite: NEGATIVE
PH: 5 (ref 5.0–8.0)
Protein, ur: NEGATIVE mg/dL
SPECIFIC GRAVITY, URINE: 1.015 (ref 1.005–1.030)

## 2016-02-05 LAB — URINE MICROSCOPIC-ADD ON

## 2016-02-05 LAB — APTT: aPTT: 24 seconds (ref 24–37)

## 2016-02-05 LAB — ABO/RH: ABO/RH(D): O POS

## 2016-02-05 LAB — SURGICAL PCR SCREEN
MRSA, PCR: NEGATIVE
Staphylococcus aureus: POSITIVE — AB

## 2016-02-05 LAB — PROTIME-INR
INR: 1.12 (ref 0.00–1.49)
PROTHROMBIN TIME: 14.2 s (ref 11.6–15.2)

## 2016-02-05 NOTE — Pre-Procedure Instructions (Signed)
02-05-16 1540 Positive Staph aureus- pt to use Mupirocin as directed, RX. Called to [facility]. ,[place], [place] , note sent to Dr. Alvan Dame office pod [phone].

## 2016-02-05 NOTE — Pre-Procedure Instructions (Signed)
02-05-16 1345 Note viewable labs in Epic-B[facility],urinalysis result.

## 2016-02-05 NOTE — Patient Instructions (Addendum)
Jimmy Frazier  02/05/2016   Your procedure is scheduled on: 02-12-16  Report to [facility] Main  Entrance take [facility]  elevators to 3rd floor to  [name] at  0830 AM.  Call this number if you have problems the morning of surgery [phone]   Remember: ONLY 1 PERSON MAY GO WITH YOU TO SHORT STAY TO GET  READY MORNING OF [name].  Do not eat food or drink liquids :After Midnight.     Take these medicines the morning of surgery with A SIP OF WATER: Felodipine. Simvastatin. Omeprazole/Oxycodone- if need.. DO NOT TAKE ANY DIABETIC MEDICATIONS DAY OF YOUR SURGERY                               You may not have any metal on your body including hair pins and              piercings  Do not wear jewelry, make-up, lotions, powders or perfumes, deodorant             Do not wear nail polish.  Do not shave  48 hours prior to surgery.              Men may shave face and neck.   Do not bring valuables to the hospital. [name].  Contacts, dentures or bridgework may not be worn into surgery.  Leave suitcase in the car. After surgery it may be brought to your room.     Patients discharged the day of surgery will not be allowed to drive home.  Name and phone number of your driver: Jimmy Frazier - spouse [phone] cell  Special Instructions: N/A              Please read over the following fact sheets you were given: _____________________________________________________________________             [facility] - Preparing for Surgery Before surgery, you can play an important role.  Because skin is not sterile, your skin needs to be as free of germs as possible.  You can reduce the number of germs on your skin by washing with CHG (chlorahexidine gluconate) soap before surgery.  CHG is an antiseptic cleaner which kills germs and bonds with the skin to continue killing germs even after washing. Please DO NOT use  if you have an allergy to CHG or antibacterial soaps.  If your skin becomes reddened/irritated stop using the CHG and inform your nurse when you arrive at Short Stay. Do not shave (including legs and underarms) for at least 48 hours prior to the first CHG shower.  You may shave your face/neck. Please follow these instructions carefully:  1.  Shower with CHG Soap the night before surgery and the  morning of Surgery.  2.  If you choose to wash your hair, wash your hair first as usual with your  normal  shampoo.  3.  After you shampoo, rinse your hair and body thoroughly to remove the  shampoo.                           4.  Use CHG as you would any other liquid soap.  You  can apply chg directly  to the skin and wash                       Gently with a scrungie or clean washcloth.  5.  Apply the CHG Soap to your body ONLY FROM THE NECK DOWN.   Do not use on face/ open                           Wound or open sores. Avoid contact with eyes, ears mouth and genitals (private parts).                       Wash face,  Genitals (private parts) with your normal soap.             6.  Wash thoroughly, paying special attention to the area where your surgery  will be performed.  7.  Thoroughly rinse your body with warm water from the neck down.  8.  DO NOT shower/wash with your normal soap after using and rinsing off  the CHG Soap.                9.  Pat yourself dry with a clean towel.            10.  Wear clean pajamas.            11.  Place clean sheets on your bed the night of your first shower and do not  sleep with pets. Day of Surgery : Do not apply any lotions/deodorants the morning of surgery.  Please wear clean clothes to the hospital/surgery center.  FAILURE TO FOLLOW THESE INSTRUCTIONS MAY RESULT IN THE CANCELLATION OF YOUR SURGERY PATIENT SIGNATURE_________________________________  NURSE  SIGNATURE__________________________________  ________________________________________________________________________   Jimmy Frazier  An incentive spirometer is a tool that can help keep your lungs clear and active. This tool measures how well you are filling your lungs with each breath. Taking long deep breaths may help reverse or decrease the chance of developing breathing (pulmonary) problems (especially infection) following:  A long period of time when you are unable to move or be active. BEFORE THE PROCEDURE   If the spirometer includes an indicator to show your best effort, your nurse or respiratory therapist will set it to a desired goal.  If possible, sit up straight or lean slightly forward. Try not to slouch.  Hold the incentive spirometer in an upright position. INSTRUCTIONS FOR USE   Sit on the edge of your bed if possible, or sit up as far as you can in bed or on a chair.  Hold the incentive spirometer in an upright position.  Breathe out normally.  Place the mouthpiece in your mouth and seal your lips tightly around it.  Breathe in slowly and as deeply as possible, raising the piston or the ball toward the top of the column.  Hold your breath for 3-5 seconds or for as long as possible. Allow the piston or ball to fall to the bottom of the column.  Remove the mouthpiece from your mouth and breathe out normally.  Rest for a few seconds and repeat Steps 1 through 7 at least 10 times every 1-2 hours when you are awake. Take your time and take a few normal breaths between deep breaths.  The spirometer may include an indicator to show your best effort. Use the indicator as a goal to work toward during  each repetition.  After each set of 10 deep breaths, practice coughing to be sure your lungs are clear. If you have an incision (the cut made at the time of surgery), support your incision when coughing by placing a pillow or rolled up towels firmly against it. Once  you are able to get out of bed, walk around indoors and cough well. You may stop using the incentive spirometer when instructed by your caregiver.  RISKS AND COMPLICATIONS  Take your time so you do not get dizzy or light-headed.  If you are in pain, you may need to take or ask for pain medication before doing incentive spirometry. It is harder to take a deep breath if you are having pain. AFTER USE  Rest and breathe slowly and easily.  It can be helpful to keep track of a log of your progress. Your caregiver can provide you with a simple table to help with this. If you are using the spirometer at home, follow these instructions: [place] IF:   You are having difficultly using the spirometer.  You have trouble using the spirometer as often as instructed.  Your pain medication is not giving enough relief while using the spirometer.  You develop fever of 100.5 F (38.1 C) or higher. SEEK IMMEDIATE MEDICAL CARE IF:   You cough up bloody sputum that had not been present before.  You develop fever of 102 F (38.9 C) or greater.  You develop worsening pain at or near the incision site. MAKE SURE YOU:   Understand these instructions.  Will watch your condition.  Will get help right away if you are not doing well or get worse. Document Released: 03/02/2007 Document Revised: 01/12/2012 Document Reviewed: 05/03/2007 ExitCare Patient Information 2014 Exit[employer], [place].   ________________________________________________________________________  WHAT IS A BLOOD TRANSFUSION? Blood Transfusion Information  A transfusion is the replacement of blood or some of its parts. Blood is made up of multiple cells which provide different functions.  Red blood cells carry oxygen and are used for blood loss replacement.  White blood cells fight against infection.  Platelets control bleeding.  Plasma helps clot blood.  Other blood products are available for specialized needs, such as  hemophilia or other clotting disorders. BEFORE THE TRANSFUSION  Who gives blood for transfusions?   Healthy volunteers who are fully evaluated to make sure their blood is safe. This is blood bank blood. Transfusion therapy is the safest it has ever been in the practice of medicine. Before blood is taken from a donor, a complete history is taken to make sure that person has no history of diseases nor engages in risky social behavior (examples are intravenous drug use or sexual activity with multiple partners). The donor's travel history is screened to minimize risk of transmitting infections, such as malaria. The donated blood is tested for signs of infectious diseases, such as HIV and hepatitis. The blood is then tested to be sure it is compatible with you in order to minimize the chance of a transfusion reaction. If you or a relative donates blood, this is often done in anticipation of surgery and is not appropriate for emergency situations. It takes many days to process the donated blood. RISKS AND COMPLICATIONS Although transfusion therapy is very safe and saves many lives, the main dangers of transfusion include:   Getting an infectious disease.  Developing a transfusion reaction. This is an allergic reaction to something in the blood you were given. Every precaution is taken to prevent  this. The decision to have a blood transfusion has been considered carefully by your caregiver before blood is given. Blood is not given unless the benefits outweigh the risks. AFTER THE TRANSFUSION  Right after receiving a blood transfusion, you will usually feel much better and more energetic. This is especially true if your red blood cells have gotten low (anemic). The transfusion raises the level of the red blood cells which carry oxygen, and this usually causes an energy increase.  The nurse administering the transfusion will monitor you carefully for complications. HOME CARE INSTRUCTIONS  No special  instructions are needed after a transfusion. You may find your energy is better. Speak with your caregiver about any limitations on activity for underlying diseases you may have. SEEK MEDICAL CARE IF:   Your condition is not improving after your transfusion.  You develop redness or irritation at the intravenous (IV) site. SEEK IMMEDIATE MEDICAL CARE IF:  Any of the following symptoms occur over the next 12 hours:  Shaking chills.  You have a temperature by mouth above 102 F (38.9 C), not controlled by medicine.  Chest, back, or muscle pain.  People around you feel you are not acting correctly or are confused.  Shortness of breath or difficulty breathing.  Dizziness and fainting.  You get a rash or develop hives.  You have a decrease in urine output.  Your urine turns a dark color or changes to pink, red, or brown. Any of the following symptoms occur over the next 10 days:  You have a temperature by mouth above 102 F (38.9 C), not controlled by medicine.  Shortness of breath.  Weakness after normal activity.  The white part of the eye turns yellow (jaundice).  You have a decrease in the amount of urine or are urinating less often.  Your urine turns a dark color or changes to pink, red, or brown. Document Released: 10/17/2000 Document Revised: 01/12/2012 Document Reviewed: 06/05/2008 [facility] Patient Information 2014 [place], [place].  _______________________________________________________________________

## 2016-02-05 NOTE — Pre-Procedure Instructions (Addendum)
EKG done today.Clearance note -(Dr. Jeralene Huff 01-03-16) - with chart.

## 2016-02-06 LAB — HEMOGLOBIN A1C
HEMOGLOBIN A1C: 6.8 % — AB (ref 4.8–5.6)
Mean Plasma Glucose: 148 mg/dL

## 2016-02-12 ENCOUNTER — Inpatient Hospital Stay (HOSPITAL_COMMUNITY): Payer: PPO | Admitting: Anesthesiology

## 2016-02-12 ENCOUNTER — Encounter (HOSPITAL_COMMUNITY): Payer: Self-pay | Admitting: *Deleted

## 2016-02-12 ENCOUNTER — Encounter (HOSPITAL_COMMUNITY)
Admission: RE | Disposition: A | Discharge status: Home Health | Payer: Self-pay | Source: Ambulatory Visit | Attending: Orthopedic Surgery

## 2016-02-12 ENCOUNTER — Inpatient Hospital Stay (HOSPITAL_COMMUNITY)
Admission: RE | Admit: 2016-02-12 | Discharge: 2016-02-13 | DRG: 470 | Disposition: A | Discharge status: Home Health | Payer: PPO | Source: Ambulatory Visit | Attending: Orthopedic Surgery | Admitting: Orthopedic Surgery

## 2016-02-12 DIAGNOSIS — M1711 Unilateral primary osteoarthritis, right knee: Principal | ICD-10-CM | POA: Diagnosis present

## 2016-02-12 DIAGNOSIS — K219 Gastro-esophageal reflux disease without esophagitis: Secondary | ICD-10-CM | POA: Diagnosis not present

## 2016-02-12 DIAGNOSIS — E119 Type 2 diabetes mellitus without complications: Secondary | ICD-10-CM | POA: Diagnosis not present

## 2016-02-12 DIAGNOSIS — Z01812 Encounter for preprocedural laboratory examination: Secondary | ICD-10-CM

## 2016-02-12 DIAGNOSIS — Z6832 Body mass index (BMI) 32.0-32.9, adult: Secondary | ICD-10-CM | POA: Diagnosis not present

## 2016-02-12 DIAGNOSIS — M659 Synovitis and tenosynovitis, unspecified: Secondary | ICD-10-CM | POA: Diagnosis not present

## 2016-02-12 DIAGNOSIS — Z96612 Presence of left artificial shoulder joint: Secondary | ICD-10-CM | POA: Diagnosis not present

## 2016-02-12 DIAGNOSIS — M25561 Pain in right knee: Secondary | ICD-10-CM | POA: Diagnosis not present

## 2016-02-12 DIAGNOSIS — I10 Essential (primary) hypertension: Secondary | ICD-10-CM | POA: Diagnosis present

## 2016-02-12 DIAGNOSIS — N189 Chronic kidney disease, unspecified: Secondary | ICD-10-CM | POA: Diagnosis not present

## 2016-02-12 DIAGNOSIS — Z96659 Presence of unspecified artificial knee joint: Secondary | ICD-10-CM

## 2016-02-12 DIAGNOSIS — I129 Hypertensive chronic kidney disease with stage 1 through stage 4 chronic kidney disease, or unspecified chronic kidney disease: Secondary | ICD-10-CM | POA: Diagnosis not present

## 2016-02-12 DIAGNOSIS — M179 Osteoarthritis of knee, unspecified: Secondary | ICD-10-CM | POA: Diagnosis not present

## 2016-02-12 DIAGNOSIS — E669 Obesity, unspecified: Secondary | ICD-10-CM | POA: Diagnosis not present

## 2016-02-12 DIAGNOSIS — Z96651 Presence of right artificial knee joint: Secondary | ICD-10-CM

## 2016-02-12 HISTORY — PX: TOTAL KNEE ARTHROPLASTY: SHX125

## 2016-02-12 LAB — TYPE AND SCREEN
ABO/RH(D): O POS
ANTIBODY SCREEN: NEGATIVE

## 2016-02-12 LAB — GLUCOSE, CAPILLARY
GLUCOSE-CAPILLARY: 163 mg/dL — AB (ref 65–99)
Glucose-Capillary: 126 mg/dL — ABNORMAL HIGH (ref 65–99)

## 2016-02-12 SURGERY — ARTHROPLASTY, KNEE, TOTAL
Anesthesia: Monitor Anesthesia Care | Site: Knee | Laterality: Right

## 2016-02-12 MED ORDER — ONDANSETRON HCL 4 MG/2ML IJ SOLN
INTRAMUSCULAR | Status: DC | PRN
  Administered 2016-02-12: 4 mg via INTRAVENOUS

## 2016-02-12 MED ORDER — BUPIVACAINE-EPINEPHRINE (PF) 0.25% -1:200000 IJ SOLN
INTRAMUSCULAR | Status: DC | PRN
  Administered 2016-02-12: 30 mL

## 2016-02-12 MED ORDER — PANTOPRAZOLE SODIUM 40 MG PO TBEC
40.0000 mg | DELAYED_RELEASE_TABLET | Freq: Every day | ORAL | Status: DC
Start: 2016-02-12 — End: 2016-02-13
  Administered 2016-02-13: 40 mg via ORAL
  Filled 2016-02-12: qty 1

## 2016-02-12 MED ORDER — PROPOFOL 10 MG/ML IV BOLUS
INTRAVENOUS | Status: AC
  Filled 2016-02-12: qty 20

## 2016-02-12 MED ORDER — SODIUM CHLORIDE 0.9 % IR SOLN
Status: DC | PRN
  Administered 2016-02-12: 1000 mL

## 2016-02-12 MED ORDER — KETOROLAC TROMETHAMINE 30 MG/ML IJ SOLN
INTRAMUSCULAR | Status: AC
  Filled 2016-02-12: qty 1

## 2016-02-12 MED ORDER — ONDANSETRON HCL 4 MG/2ML IJ SOLN
INTRAMUSCULAR | Status: AC
Start: 2016-02-12 — End: 2016-02-12
  Filled 2016-02-12: qty 2

## 2016-02-12 MED ORDER — ALUM & MAG HYDROXIDE-SIMETH 200-200-20 MG/5ML PO SUSP: 30.0000 mL | ORAL | Status: DC | PRN

## 2016-02-12 MED ORDER — FERROUS SULFATE 325 (65 FE) MG PO TABS
325.0000 mg | ORAL_TABLET | Freq: Three times a day (TID) | ORAL | Status: DC
  Administered 2016-02-13: 325 mg via ORAL
  Filled 2016-02-12 (×4): qty 1

## 2016-02-12 MED ORDER — TRANEXAMIC ACID 1000 MG/10ML IV SOLN
1000.0000 mg | Freq: Once | INTRAVENOUS | Status: AC
  Administered 2016-02-12: 1000 mg via INTRAVENOUS
  Filled 2016-02-12: qty 10

## 2016-02-12 MED ORDER — MENTHOL 3 MG MT LOZG: 1.0000 | LOZENGE | OROMUCOSAL | Status: DC | PRN

## 2016-02-12 MED ORDER — LIDOCAINE HCL 1 % IJ SOLN
INTRAMUSCULAR | Status: AC
  Filled 2016-02-12: qty 20

## 2016-02-12 MED ORDER — CEFAZOLIN SODIUM-DEXTROSE 2-4 GM/100ML-% IV SOLN
INTRAVENOUS | Status: AC
  Filled 2016-02-12: qty 100

## 2016-02-12 MED ORDER — HYDROCODONE-ACETAMINOPHEN 7.5-325 MG PO TABS
1.0000 | ORAL_TABLET | ORAL | Status: DC
  Administered 2016-02-12 (×2): 2 via ORAL
  Administered 2016-02-12: 1 via ORAL
  Administered 2016-02-13 (×4): 2 via ORAL
  Filled 2016-02-12 (×5): qty 2
  Filled 2016-02-12: qty 1

## 2016-02-12 MED ORDER — FELODIPINE ER 10 MG PO TB24
10.0000 mg | ORAL_TABLET | Freq: Every day | ORAL | Status: DC
  Filled 2016-02-12: qty 1

## 2016-02-12 MED ORDER — PHENYLEPHRINE HCL 10 MG/ML IJ SOLN
INTRAMUSCULAR | Status: DC | PRN
  Administered 2016-02-12 (×5): 80 ug via INTRAVENOUS

## 2016-02-12 MED ORDER — CELECOXIB 200 MG PO CAPS
200.0000 mg | ORAL_CAPSULE | Freq: Two times a day (BID) | ORAL | Status: DC
  Administered 2016-02-12 – 2016-02-13 (×2): 200 mg via ORAL
  Filled 2016-02-12 (×3): qty 1

## 2016-02-12 MED ORDER — PHENOL 1.4 % MT LIQD: 1.0000 | OROMUCOSAL | Status: DC | PRN

## 2016-02-12 MED ORDER — PROPOFOL 10 MG/ML IV BOLUS
INTRAVENOUS | Status: DC | PRN
  Administered 2016-02-12 (×2): 20 mg via INTRAVENOUS

## 2016-02-12 MED ORDER — CHLORHEXIDINE GLUCONATE 4 % EX LIQD: 60.0000 mL | Freq: Once | CUTANEOUS | Status: DC

## 2016-02-12 MED ORDER — BISACODYL 10 MG RE SUPP
10.0000 mg | Freq: Every day | RECTAL | Status: DC | PRN
Start: 2016-02-12 — End: 2016-02-13

## 2016-02-12 MED ORDER — POLYETHYLENE GLYCOL 3350 17 G PO PACK
17.0000 g | PACK | Freq: Two times a day (BID) | ORAL | Status: DC
  Administered 2016-02-13: 17 g via ORAL

## 2016-02-12 MED ORDER — METHYLPREDNISOLONE ACETATE 40 MG/ML IJ SUSP
INTRAMUSCULAR | Status: AC
  Filled 2016-02-12: qty 2

## 2016-02-12 MED ORDER — METHOCARBAMOL 500 MG PO TABS
500.0000 mg | ORAL_TABLET | Freq: Four times a day (QID) | ORAL | Status: DC | PRN
  Administered 2016-02-12 – 2016-02-13 (×2): 500 mg via ORAL
  Filled 2016-02-12 (×2): qty 1

## 2016-02-12 MED ORDER — DEXAMETHASONE SODIUM PHOSPHATE 10 MG/ML IJ SOLN
INTRAMUSCULAR | Status: AC
  Filled 2016-02-12: qty 1

## 2016-02-12 MED ORDER — DEXAMETHASONE SODIUM PHOSPHATE 10 MG/ML IJ SOLN
10.0000 mg | Freq: Once | INTRAMUSCULAR | Status: AC
  Administered 2016-02-12: 10 mg via INTRAVENOUS

## 2016-02-12 MED ORDER — FENTANYL CITRATE (PF) 250 MCG/5ML IJ SOLN
INTRAMUSCULAR | Status: DC | PRN
  Administered 2016-02-12: 100 ug via INTRAVENOUS

## 2016-02-12 MED ORDER — CEFAZOLIN SODIUM-DEXTROSE 2-4 GM/100ML-% IV SOLN
2.0000 g | INTRAVENOUS | Status: AC
  Administered 2016-02-12: 2 g via INTRAVENOUS
  Filled 2016-02-12: qty 100

## 2016-02-12 MED ORDER — SIMVASTATIN 20 MG PO TABS
20.0000 mg | ORAL_TABLET | Freq: Every morning | ORAL | Status: DC
  Administered 2016-02-13: 20 mg via ORAL
  Filled 2016-02-12: qty 1

## 2016-02-12 MED ORDER — HYDROMORPHONE HCL 1 MG/ML IJ SOLN: 0.2500 mg | INTRAMUSCULAR | Status: DC | PRN

## 2016-02-12 MED ORDER — LACTATED RINGERS IV SOLN: INTRAVENOUS | Status: DC

## 2016-02-12 MED ORDER — PHENYLEPHRINE 40 MCG/ML (10ML) SYRINGE FOR IV PUSH (FOR BLOOD PRESSURE SUPPORT)
PREFILLED_SYRINGE | INTRAVENOUS | Status: AC
  Filled 2016-02-12: qty 10

## 2016-02-12 MED ORDER — PROPOFOL 500 MG/50ML IV EMUL
INTRAVENOUS | Status: DC | PRN
  Administered 2016-02-12: 100 ug/kg/min via INTRAVENOUS

## 2016-02-12 MED ORDER — HYDROMORPHONE HCL 1 MG/ML IJ SOLN
0.5000 mg | INTRAMUSCULAR | Status: DC | PRN
  Administered 2016-02-12: 1 mg via INTRAVENOUS
  Filled 2016-02-12: qty 1

## 2016-02-12 MED ORDER — POTASSIUM CHLORIDE 2 MEQ/ML IV SOLN
INTRAVENOUS | Status: DC
  Administered 2016-02-12: 14:00:00 via INTRAVENOUS
  Filled 2016-02-12 (×3): qty 1000

## 2016-02-12 MED ORDER — DOCUSATE SODIUM 100 MG PO CAPS
100.0000 mg | ORAL_CAPSULE | Freq: Two times a day (BID) | ORAL | Status: DC
  Administered 2016-02-12 – 2016-02-13 (×2): 100 mg via ORAL

## 2016-02-12 MED ORDER — KETOROLAC TROMETHAMINE 30 MG/ML IJ SOLN
INTRAMUSCULAR | Status: DC | PRN
  Administered 2016-02-12: 30 mg

## 2016-02-12 MED ORDER — PROPOFOL 10 MG/ML IV BOLUS
INTRAVENOUS | Status: AC
  Filled 2016-02-12: qty 60

## 2016-02-12 MED ORDER — METOCLOPRAMIDE HCL 10 MG PO TABS
5.0000 mg | ORAL_TABLET | Freq: Three times a day (TID) | ORAL | Status: DC | PRN
  Administered 2016-02-13: 10 mg via ORAL
  Filled 2016-02-12: qty 1

## 2016-02-12 MED ORDER — FENTANYL CITRATE (PF) 100 MCG/2ML IJ SOLN
INTRAMUSCULAR | Status: AC
  Filled 2016-02-12: qty 2

## 2016-02-12 MED ORDER — SODIUM CHLORIDE 0.9 % IJ SOLN
INTRAMUSCULAR | Status: AC
  Filled 2016-02-12: qty 50

## 2016-02-12 MED ORDER — ASPIRIN EC 325 MG PO TBEC
325.0000 mg | DELAYED_RELEASE_TABLET | Freq: Two times a day (BID) | ORAL | Status: DC
  Administered 2016-02-13: 325 mg via ORAL
  Filled 2016-02-12 (×3): qty 1

## 2016-02-12 MED ORDER — LACTATED RINGERS IV SOLN
INTRAVENOUS | Status: DC | PRN
  Administered 2016-02-12 (×3): via INTRAVENOUS

## 2016-02-12 MED ORDER — MIDAZOLAM HCL 2 MG/2ML IJ SOLN
INTRAMUSCULAR | Status: AC
  Filled 2016-02-12: qty 2

## 2016-02-12 MED ORDER — METHYLPREDNISOLONE ACETATE 80 MG/ML IJ SUSP
INTRAMUSCULAR | Status: DC | PRN
Start: 2016-02-12 — End: 2016-02-12
  Administered 2016-02-12: 80 mg

## 2016-02-12 MED ORDER — CEFAZOLIN SODIUM-DEXTROSE 2-4 GM/100ML-% IV SOLN
2.0000 g | Freq: Four times a day (QID) | INTRAVENOUS | Status: AC
  Administered 2016-02-12 (×2): 2 g via INTRAVENOUS
  Filled 2016-02-12 (×2): qty 100

## 2016-02-12 MED ORDER — METOCLOPRAMIDE HCL 5 MG/ML IJ SOLN: 5.0000 mg | Freq: Three times a day (TID) | INTRAMUSCULAR | Status: DC | PRN

## 2016-02-12 MED ORDER — BUPIVACAINE-EPINEPHRINE (PF) 0.25% -1:200000 IJ SOLN
INTRAMUSCULAR | Status: AC
  Filled 2016-02-12: qty 30

## 2016-02-12 MED ORDER — MAGNESIUM CITRATE PO SOLN: 1.0000 | Freq: Once | ORAL | Status: DC | PRN

## 2016-02-12 MED ORDER — ONDANSETRON HCL 4 MG/2ML IJ SOLN
4.0000 mg | Freq: Four times a day (QID) | INTRAMUSCULAR | Status: DC | PRN
  Administered 2016-02-13: 4 mg via INTRAVENOUS
  Filled 2016-02-12: qty 2

## 2016-02-12 MED ORDER — DIPHENHYDRAMINE HCL 25 MG PO CAPS: 25.0000 mg | ORAL_CAPSULE | Freq: Four times a day (QID) | ORAL | Status: DC | PRN

## 2016-02-12 MED ORDER — LIDOCAINE HCL 1 % IJ SOLN
INTRAMUSCULAR | Status: DC | PRN
  Administered 2016-02-12: 5 mL

## 2016-02-12 MED ORDER — SODIUM CHLORIDE 0.9 % IJ SOLN
INTRAMUSCULAR | Status: DC | PRN
  Administered 2016-02-12: 30 mL

## 2016-02-12 MED ORDER — BUPIVACAINE IN DEXTROSE 0.75-8.25 % IT SOLN
INTRATHECAL | Status: DC | PRN
  Administered 2016-02-12: 15 mg via INTRATHECAL

## 2016-02-12 MED ORDER — 0.9 % SODIUM CHLORIDE (POUR BTL) OPTIME
TOPICAL | Status: DC | PRN
  Administered 2016-02-12: 1000 mL

## 2016-02-12 MED ORDER — MIDAZOLAM HCL 5 MG/5ML IJ SOLN
INTRAMUSCULAR | Status: DC | PRN
  Administered 2016-02-12: 2 mg via INTRAVENOUS

## 2016-02-12 MED ORDER — ONDANSETRON HCL 4 MG PO TABS: 4.0000 mg | ORAL_TABLET | Freq: Four times a day (QID) | ORAL | Status: DC | PRN

## 2016-02-12 MED ORDER — DEXAMETHASONE SODIUM PHOSPHATE 10 MG/ML IJ SOLN
10.0000 mg | Freq: Once | INTRAMUSCULAR | Status: AC
  Administered 2016-02-13: 10 mg via INTRAVENOUS
  Filled 2016-02-12: qty 1

## 2016-02-12 MED ORDER — DEXTROSE 5 % IV SOLN
500.0000 mg | Freq: Four times a day (QID) | INTRAVENOUS | Status: DC | PRN
  Filled 2016-02-12: qty 5

## 2016-02-12 SURGICAL SUPPLY — 47 items
BAG DECANTER FOR FLEXI CONT (MISCELLANEOUS) IMPLANT
BAG ZIPLOCK 12X15 (MISCELLANEOUS) IMPLANT
BANDAGE ACE 6X5 VEL STRL LF (GAUZE/BANDAGES/DRESSINGS) ×3 IMPLANT
BLADE SAW SGTL 13.0X1.19X90.0M (BLADE) ×3 IMPLANT
BONE CEMENT GENTAMICIN (Cement) ×6 IMPLANT
BOWL SMART MIX CTS (DISPOSABLE) ×3 IMPLANT
CAPT KNEE TOTAL 3 ATTUNE ×3 IMPLANT
CEMENT BONE GENTAMICIN 40 (Cement) ×2 IMPLANT
CLOTH BEACON ORANGE TIMEOUT ST (SAFETY) ×3 IMPLANT
CUFF TOURN SGL QUICK 34 (TOURNIQUET CUFF) ×2
CUFF TRNQT CYL 34X4X40X1 (TOURNIQUET CUFF) ×1 IMPLANT
DECANTER SPIKE VIAL GLASS SM (MISCELLANEOUS) ×3 IMPLANT
DRAPE U-SHAPE 47X51 STRL (DRAPES) ×3 IMPLANT
DRSG AQUACEL AG ADV 3.5X10 (GAUZE/BANDAGES/DRESSINGS) ×3 IMPLANT
DURAPREP 26ML APPLICATOR (WOUND CARE) ×6 IMPLANT
ELECT REM PT RETURN 9FT ADLT (ELECTROSURGICAL) ×3
ELECTRODE REM PT RTRN 9FT ADLT (ELECTROSURGICAL) ×1 IMPLANT
GLOVE BIOGEL M 7.0 STRL (GLOVE) ×3 IMPLANT
GLOVE BIOGEL PI IND STRL 7.5 (GLOVE) ×7 IMPLANT
GLOVE BIOGEL PI IND STRL 8.5 (GLOVE) IMPLANT
GLOVE BIOGEL PI INDICATOR 7.5 (GLOVE) ×14
GLOVE BIOGEL PI INDICATOR 8.5 (GLOVE)
GLOVE ECLIPSE 8.0 STRL XLNG CF (GLOVE) IMPLANT
GLOVE ORTHO TXT STRL SZ7.5 (GLOVE) ×6 IMPLANT
GOWN SPEC L3 XXLG W/TWL (GOWN DISPOSABLE) ×3 IMPLANT
GOWN STRL REUS W/TWL LRG LVL3 (GOWN DISPOSABLE) ×3 IMPLANT
GOWN STRL REUS W/TWL XL LVL3 (GOWN DISPOSABLE) ×6 IMPLANT
HANDPIECE INTERPULSE COAX TIP (DISPOSABLE) ×2
LIQUID BAND (GAUZE/BANDAGES/DRESSINGS) ×3 IMPLANT
MANIFOLD NEPTUNE II (INSTRUMENTS) ×3 IMPLANT
PACK TOTAL KNEE CUSTOM (KITS) ×3 IMPLANT
POSITIONER SURGICAL ARM (MISCELLANEOUS) ×6 IMPLANT
SET HNDPC FAN SPRY TIP SCT (DISPOSABLE) ×1 IMPLANT
SET PAD KNEE POSITIONER (MISCELLANEOUS) ×3 IMPLANT
SUCTION FRAZIER HANDLE 12FR (TUBING) ×2
SUCTION TUBE FRAZIER 12FR DISP (TUBING) ×1 IMPLANT
SUT MNCRL AB 4-0 PS2 18 (SUTURE) ×3 IMPLANT
SUT VIC AB 1 CT1 36 (SUTURE) ×3 IMPLANT
SUT VIC AB 2-0 CT1 27 (SUTURE) ×6
SUT VIC AB 2-0 CT1 TAPERPNT 27 (SUTURE) ×3 IMPLANT
SUT VLOC 180 0 24IN GS25 (SUTURE) ×3 IMPLANT
SYR 50ML LL SCALE MARK (SYRINGE) IMPLANT
TRAY FOLEY W/METER SILVER 14FR (SET/KITS/TRAYS/PACK) IMPLANT
TRAY FOLEY W/METER SILVER 16FR (SET/KITS/TRAYS/PACK) ×3 IMPLANT
WATER STERILE IRR 1500ML POUR (IV SOLUTION) ×3 IMPLANT
WRAP KNEE MAXI GEL POST OP (GAUZE/BANDAGES/DRESSINGS) ×3 IMPLANT
YANKAUER SUCT BULB TIP 10FT TU (MISCELLANEOUS) ×3 IMPLANT

## 2016-02-12 NOTE — Evaluation (Signed)
Physical Therapy Evaluation Patient Details Name: Jimmy Frazier MRN: [id number] DOB: 1951/09/02 Today's Date: 02/12/2016   History of Present Illness  s/p RTKA , and Ltotal shoulder replacement in 2014.   Clinical Impression  Pt s/p RTKA , tolerated mobility well, ambulation limited today due to pt felt slightly wozzy. Pt limited in strength, ROM and mobility , continue PT to increase all of these and educate in order to transition home safely with wife assisting.     Follow Up Recommendations Home health PT    Equipment Recommendations  Rolling walker with 5" wheels    Recommendations for Other Services       Precautions / Restrictions Precautions Precautions: Knee Restrictions Weight Bearing Restrictions: No      Mobility  Bed Mobility Overal bed mobility: Needs Assistance Bed Mobility: Supine to Sit     Supine to sit: Min assist     General bed mobility comments: assist with upper body and LE min assist  Transfers Overall transfer level: Needs assistance Equipment used: Rolling walker (2 wheeled) Transfers: Sit to/from Stand Sit to Stand: Min assist         General transfer comment: cues for hand placement for RW safety adn controlled decent stand to sit.   Ambulation/Gait Ambulation/Gait assistance: Min guard;+2 safety/equipment (wife followed with chair due to pt felt slightly wozzy) Ambulation Distance (Feet): 10 Feet Assistive device: Rolling walker (2 wheeled) Gait Pattern/deviations: Step-to pattern     General Gait Details: pt tolerated well, adn mobility well, just limited due to pt feeling clamy and wozzy, however BP once sitting was WNL  . Pt began to feel better after sitting for a few minutes. Pt stated pain meds are likely making him feel this way.   Stairs            Wheelchair Mobility    Modified Rankin (Stroke Patients Only)       Balance                                             Pertinent  Vitals/Pain Pain Assessment: 0-10 Pain Score: 6  Pain Location: R knee (however before we started pt stated zero and when asked if pain increased, he stated no, but rated it higher this time. Pain meds were given prior to session.  Pain Descriptors / Indicators: Aching Pain Intervention(s): Monitored during session;Premedicated before session;Ice applied    Home Living Family/patient expects to be discharged to:: Private residence Living Arrangements: Spouse/significant other Available Help at Discharge: Family;Available 24 hours/day Type of Home: House       Home Layout: Two level;Able to live on main level with bedroom/bathroom Home Equipment: None      Prior Function Level of Independence: Independent               Hand Dominance        Extremity/Trunk Assessment               Lower Extremity Assessment: RLE deficits/detail RLE Deficits / Details: limited with knee flexion due to p/o and pain, grossly R knee flexion supine 10-50. pt with slight knee flexion with tightness even prior to surgery per pt       Communication   Communication: No difficulties  Cognition Arousal/Alertness: Awake/alert Behavior During Therapy: WFL for tasks assessed/performed Overall Cognitive Status: Within Functional Limits for tasks assessed  General Comments      Exercises Total Joint Exercises Ankle Circles/Pumps: AROM;Right;10 reps;Supine Quad Sets: AROM;Supine;Right;10 reps Heel Slides: AAROM;Supine;Right;10 reps Hip ABduction/ADduction: AAROM;Supine;Right;5 reps Straight Leg Raises: AAROM;Supine;Right;5 reps Goniometric ROM: 10-50      Assessment/Plan    PT Assessment Patient needs continued PT services  PT Diagnosis Difficulty walking   PT Problem List Decreased strength;Decreased range of motion;Decreased activity tolerance;Decreased mobility;Decreased knowledge of use of DME  PT Treatment Interventions DME instruction;Gait  training;Stair training;Functional mobility training;Therapeutic activities;Therapeutic exercise;Patient/family education   PT Goals (Current goals can be found in the Care Plan section) Acute Rehab PT Goals Patient Stated Goal: I want to be able to walk again, and coming back fro my other knee the end of this month.  PT Goal Formulation: With patient Time For Goal Achievement: 02/26/16 Potential to Achieve Goals: Good    Frequency 7X/week   Barriers to discharge        Co-evaluation               End of Session Equipment Utilized During Treatment: Gait belt Activity Tolerance: Patient tolerated treatment well Patient left: in chair;with family/visitor present;with call bell/phone within reach;with chair alarm set Nurse Communication: Mobility status         Time: 1630-1700 PT Time Calculation (min) (ACUTE ONLY): 30 min   Charges:   PT Evaluation $PT Eval Low Complexity: 1 Procedure PT Treatments $Gait Training: 8-22 mins   PT G CodesClide Dales 2016/02/28, 5:19 PM Clide Dales, PT Pager: [phone] Feb 28, 2016

## 2016-02-12 NOTE — Transfer of Care (Signed)
Immediate Anesthesia Transfer of Care Note  Patient: Jimmy Frazier  Procedure(s) Performed: Procedure(s): TOTAL RIGHT KNEE ARTHROPLASTY,INTRARTICULAR INJECTION LEFT KNEE (Right)  Patient Location: PACU  Anesthesia Type:MAC and Spinal  Level of Consciousness: awake, alert  and patient cooperative  Airway & Oxygen Therapy: Patient Spontanous Breathing and Patient connected to face mask oxygen  Post-op Assessment: Report given to RN and Post -op Vital signs reviewed and stable  Post vital signs: Reviewed and stable  Last Vitals:  Filed Vitals:   02/12/16 0625  BP: 115/71  Pulse: 92  Temp: 36.6 C  Resp: 16    Complications: No apparent anesthesia complications

## 2016-02-12 NOTE — Anesthesia Postprocedure Evaluation (Signed)
Anesthesia Post Note  Patient: Jimmy Frazier  Procedure(s) Performed: Procedure(s) (LRB): TOTAL RIGHT KNEE ARTHROPLASTY,INTRARTICULAR INJECTION LEFT KNEE (Right)  Patient location during evaluation: PACU Anesthesia Type: Spinal and MAC Level of consciousness: awake and alert Pain management: pain level controlled Vital Signs Assessment: post-procedure vital signs reviewed and stable Respiratory status: spontaneous breathing and respiratory function stable Cardiovascular status: blood pressure returned to baseline and stable Postop Assessment: spinal receding Anesthetic complications: no    Last Vitals:  Filed Vitals:   02/12/16 1130 02/12/16 1145  BP: 117/69 117/71  Pulse: 66 61  Temp:  36.4 C  Resp: 19 16    Last Pain:  Filed Vitals:   02/12/16 1150  PainSc: 0-No pain                 Aryeh Butterfield DANIEL

## 2016-02-12 NOTE — Op Note (Signed)
NAME:  Jimmy Frazier                      MEDICAL RECORD NO.:  [id number]                             FACILITY:  [facility]      PHYSICIAN:  Pietro Cassis. Alvan Dame, M.D.  DATE OF BIRTH:  14-Oct-1951      DATE OF PROCEDURE:  02/12/2016                                     OPERATIVE REPORT         PREOPERATIVE DIAGNOSIS:  Right knee osteoarthritis.      POSTOPERATIVE DIAGNOSIS:  Right knee osteoarthritis.      FINDINGS:  The patient was noted to have complete loss of cartilage and   bone-on-bone arthritis with associated osteophytes in all three compartments of   the knee with a significant synovitis and associated effusion and large osteophytic complexes.      PROCEDURE:  Right total knee replacement.      COMPONENTS USED:  DePuy Attune rotating platform posterior stabilized knee   system, a size 5 femur, 5 tibia, size 7 mm PS AOX insert, and 38 anatomic patellar   button.      SURGEON:  Pietro Cassis. Alvan Dame, M.D.      ASSISTANT:  Nehemiah Massed, PA-C.      ANESTHESIA:  Spinal.      SPECIMENS:  None.      COMPLICATION:  None.      DRAINS:  One Hemovac.  EBL: <100cc      TOURNIQUET TIME:   Total Tourniquet Time Documented: Thigh (Right) - 43 minutes Total: Thigh (Right) - 43 minutes  .      The patient was stable to the recovery room.      INDICATION FOR PROCEDURE:  Jimmy Frazier is a 65 y.o. male patient of   mine.  The patient had been seen, evaluated, and treated conservatively in the   office with medication, activity modification, and injections.  The patient had   radiographic changes of bone-on-bone arthritis with endplate sclerosis and osteophytes noted.      The patient failed conservative measures including medication, injections, and activity modification, and at this point was ready for more definitive measures.   Based on the radiographic changes and failed conservative measures, the patient   decided to proceed with total knee replacement.  Risks of infection,   DVT,  component failure, need for revision surgery, postop course, and   expectations were all   discussed and reviewed.  Consent was obtained for benefit of pain   relief.      PROCEDURE IN DETAIL:  The patient was brought to the operative theater.   Once adequate anesthesia, preoperative antibiotics, 2 gm of Ancef, 1 gm of Tranexamic Acid, and 10 mg of Decadron administered, the patient was positioned supine with the right thigh tourniquet placed.  The  right lower extremity was prepped and draped in sterile fashion.  A time-   out was performed identifying the patient, planned procedure, and   extremity.      The right lower extremity was placed in the [facility] leg holder.  The leg was   exsanguinated, tourniquet elevated to 250 mmHg.  A midline incision was  made followed by median parapatellar arthrotomy.  Following initial   exposure, attention was first directed to the patella.  Precut   measurement was noted to be 27 mm.  I resected down to 15-16 mm and used a   38 patellar button to restore patellar height as well as cover the cut   surface.      The lug holes were drilled and a metal shim was placed to protect the   patella from retractors and saw blades.      At this point, attention was now directed to the femur.  The femoral   canal was opened with a drill, irrigated to try to prevent fat emboli.  An   intramedullary rod was passed at 5 degrees valgus, 9 mm of bone was   resected off the distal femur.  Following this resection, the tibia was   subluxated anteriorly.  Using the extramedullary guide, 4 mm of bone was resected off   the proximal medial tibia.  We confirmed the gap would be   stable medially and laterally with a size 5 mm insert as well as confirmed   the cut was perpendicular in the coronal plane, checking with an alignment rod.      Once this was done, I sized the femur to be a size 5 in the anterior-   posterior dimension, chose a standard component based on  medial and   lateral dimension.  The size 5 rotation block was then pinned in   position anterior referenced using the C-clamp to set rotation.  The   anterior, posterior, and  chamfer cuts were made without difficulty nor   notching making certain that I was along the anterior cortex to help   with flexion gap stability.      The final box cut was made off the lateral aspect of distal femur.      At this point, the tibia was sized to be a size 5, the size 5 tray was   then pinned in position through the medial third of the tubercle,   drilled, and keel punched.  Trial reduction was now carried with a 5 femur,  5 tibia, a size 6 then 7 mm PS insert, and the 38 anatomic patella botton.  The knee was brought to   extension, full extension with good flexion stability with the patella   tracking through the trochlea without application of pressure.  Given   all these findings, the trial components removed.  Final components were   opened and cement was mixed.  The knee was irrigated with normal saline   solution and pulse lavage.  The synovial lining was   then injected with 30 cc 0.25% Marcaine with epinephrine and 1 cc of Toradol plus 30 cc of NS for a   total of 61 cc.      The knee was irrigated.  Final implants were then cemented onto clean and   dried cut surfaces of bone with the knee brought to extension with a 7 mm trial insert.      Once the cement had fully cured, the excess cement was removed   throughout the knee.  I confirmed I was satisfied with the range of   motion and stability, and the final 7 mm PS AOX insert was chosen.  It was   placed into the knee.      The tourniquet had been let down at 43 minutes.  No significant   hemostasis required.  The   extensor mechanism was then reapproximated using #1 Vicryl and #0 V-lock sutures with the knee   in flexion.  The   remaining wound was closed with 2-0 Vicryl and running 4-0 Monocryl.   The knee was cleaned, dried,  dressed sterilely using Dermabond and   Aquacel dressing.  The patient was then   brought to recovery room in stable condition, tolerating the procedure   well.   Please note that Physician Assistant, Danae Orleans, PA-C, was present for the entirety of the case, and was utilized for pre-operative positioning, peri-operative retractor management, general facilitation of the procedure.  He was also utilized for primary wound closure at the end of the case.              Pietro Cassis Alvan Dame, M.D.    02/12/2016 10:29 AM

## 2016-02-12 NOTE — Anesthesia Procedure Notes (Signed)
Spinal Patient location during procedure: OR Start time: 02/12/2016 8:53 AM End time: 02/12/2016 9:01 AM Staffing Anesthesiologist: Duane Boston Performed by: anesthesiologist  Preanesthetic Checklist Completed: patient identified, surgical consent, pre-op evaluation, timeout performed, IV checked, risks and benefits discussed and monitors and equipment checked Spinal Block Patient position: sitting Prep: DuraPrep Patient monitoring: cardiac monitor, continuous pulse ox and blood pressure Approach: right paramedian Location: L2-3 Injection technique: single-shot Needle Needle type: Pencan  Needle gauge: 24 G Needle length: 9 cm Additional Notes Functioning IV was confirmed and monitors were applied. Sterile prep and drape, including hand hygiene and sterile gloves were used. The patient was positioned and the spine was prepped. The skin was anesthetized with lidocaine.  Free flow of clear CSF was obtained prior to injecting local anesthetic into the CSF.  The spinal needle aspirated freely following injection.  The needle was carefully withdrawn.  The patient tolerated the procedure well.

## 2016-02-12 NOTE — Anesthesia Preprocedure Evaluation (Addendum)
Anesthesia Evaluation  Patient identified by MRN, date of birth, ID band Patient awake    Reviewed: Allergy & Precautions, NPO status , Patient's Chart, lab work & pertinent test results  History of Anesthesia Complications Negative for: history of anesthetic complications  Airway Mallampati: II  TM Distance: >3 FB Neck ROM: Full    Dental  (+) Teeth Intact, Dental Advisory Given   Pulmonary neg pulmonary ROS,    Pulmonary exam normal        Cardiovascular hypertension, Pt. on medications Normal cardiovascular exam     Neuro/Psych  Headaches, negative psych ROS   GI/Hepatic Neg liver ROS, GERD  ,  Endo/Other  diabetes  Renal/GU Renal InsufficiencyRenal disease     Musculoskeletal   Abdominal   Peds  Hematology   Anesthesia Other Findings   Reproductive/Obstetrics                            Anesthesia Physical Anesthesia Plan  ASA: II  Anesthesia Plan: MAC and Spinal   Post-op Pain Management:    Induction:   Airway Management Planned: Simple Face Mask  Additional Equipment:   Intra-op Plan:   Post-operative Plan: Extubation in OR  Informed Consent: I have reviewed the patients History and Physical, chart, labs and discussed the procedure including the risks, benefits and alternatives for the proposed anesthesia with the patient or authorized representative who has indicated his/her understanding and acceptance.   Dental advisory given  Plan Discussed with: Anesthesiologist and CRNA  Anesthesia Plan Comments:        Anesthesia Quick Evaluation

## 2016-02-12 NOTE — Interval H&P Note (Signed)
History and Physical Interval Note:  02/12/2016 8:12 AM  Jimmy Frazier  has presented today for surgery, with the diagnosis of RIGHT KNEE OA  The various methods of treatment have been discussed with the patient and family. After consideration of risks, benefits and other options for treatment, the patient has consented to  Procedure(s): TOTAL RIGHT KNEE ARTHROPLASTY (Right) as a surgical intervention .  The patient's history has been reviewed, patient examined, no change in status, stable for surgery.  I have reviewed the patient's chart and labs.  Questions were answered to the patient's satisfaction.     Mauri Pole

## 2016-02-13 ENCOUNTER — Encounter (HOSPITAL_COMMUNITY): Payer: Self-pay | Admitting: Orthopedic Surgery

## 2016-02-13 DIAGNOSIS — E669 Obesity, unspecified: Secondary | ICD-10-CM | POA: Diagnosis present

## 2016-02-13 DIAGNOSIS — M179 Osteoarthritis of knee, unspecified: Secondary | ICD-10-CM | POA: Diagnosis not present

## 2016-02-13 LAB — BASIC METABOLIC PANEL
Anion gap: 9 (ref 5–15)
BUN: 30 mg/dL — AB (ref 6–20)
CALCIUM: 8.3 mg/dL — AB (ref 8.9–10.3)
CO2: 22 mmol/L (ref 22–32)
CREATININE: 1.6 mg/dL — AB (ref 0.61–1.24)
Chloride: 106 mmol/L (ref 101–111)
GFR calc Af Amer: 52 mL/min — ABNORMAL LOW (ref >60)
GFR, EST NON AFRICAN AMERICAN: 45 mL/min — AB (ref >60)
GLUCOSE: 175 mg/dL — AB (ref 65–99)
POTASSIUM: 4.8 mmol/L (ref 3.5–5.1)
SODIUM: 137 mmol/L (ref 135–145)

## 2016-02-13 LAB — CBC
HCT: 36.7 % — ABNORMAL LOW (ref 39.0–52.0)
Hemoglobin: 12.7 g/dL — ABNORMAL LOW (ref 13.0–17.0)
MCH: 28.5 pg (ref 26.0–34.0)
MCHC: 34.6 g/dL (ref 30.0–36.0)
MCV: 82.3 fL (ref 78.0–100.0)
Platelets: 195 10*3/uL (ref 150–400)
RBC: 4.46 MIL/uL (ref 4.22–5.81)
RDW: 13.6 % (ref 11.5–15.5)
WBC: 14.5 10*3/uL — ABNORMAL HIGH (ref 4.0–10.5)

## 2016-02-13 MED ORDER — HYDROCODONE-ACETAMINOPHEN 7.5-325 MG PO TABS: 1.0000 | ORAL_TABLET | ORAL | Status: DC | PRN

## 2016-02-13 MED ORDER — METHOCARBAMOL 500 MG PO TABS: 500.0000 mg | ORAL_TABLET | Freq: Four times a day (QID) | ORAL | Status: DC | PRN

## 2016-02-13 MED ORDER — DOCUSATE SODIUM 100 MG PO CAPS: 100.0000 mg | ORAL_CAPSULE | Freq: Two times a day (BID) | ORAL | Status: DC

## 2016-02-13 MED ORDER — FERROUS SULFATE 325 (65 FE) MG PO TABS: 325.0000 mg | ORAL_TABLET | Freq: Three times a day (TID) | ORAL | Status: DC

## 2016-02-13 MED ORDER — POLYETHYLENE GLYCOL 3350 17 G PO PACK: 17.0000 g | PACK | Freq: Two times a day (BID) | ORAL | Status: DC

## 2016-02-13 MED ORDER — ASPIRIN 325 MG PO TBEC: 325.0000 mg | DELAYED_RELEASE_TABLET | Freq: Two times a day (BID) | ORAL | Status: AC

## 2016-02-13 MED ORDER — CELECOXIB 200 MG PO CAPS: 200.0000 mg | ORAL_CAPSULE | Freq: Two times a day (BID) | ORAL | Status: DC

## 2016-02-13 NOTE — Progress Notes (Signed)
Pt to d/c home with [facility]. DME (3n1 & RW) delivered to room prior to d/c. AVS reviewed and "My Chart" discussed with pt. Pt capable of verbalizing medications, signs and symptoms of infection, and follow-up appointments. Remains hemodynamically stable. No signs and symptoms of distress. Educated pt to return to ER in the case of SOB, dizziness, or chest pain.

## 2016-02-13 NOTE — Progress Notes (Signed)
Occupational Therapy Evaluation Patient Details Name: Jimmy Frazier MRN: [id number] DOB: [birth year] Today's Date: 02/13/2016    History of Present Illness s/p RTKA , and Ltotal shoulder replacement in 2014.    Clinical Impression   All OT education completed and pt questions answered. No further OT needs; will sign off.    Follow Up Recommendations  No OT follow up;Supervision - Intermittent    Equipment Recommendations  3 in 1 bedside comode    Recommendations for Other Services       Precautions / Restrictions Precautions Precautions: Knee Restrictions Weight Bearing Restrictions: No Other Position/Activity Restrictions: WBAT      Mobility Bed Mobility                  Transfers                      Balance                                            ADL Overall ADL's : Needs assistance/impaired Eating/Feeding: Independent;Sitting   Grooming: Set up;Sitting   Upper Body Bathing: Set up;Sitting   Lower Body Bathing: Minimal assistance;Sit to/from stand   Upper Body Dressing : Set up;Sitting   Lower Body Dressing: Minimal assistance;Moderate assistance;Sit to/from stand                 General ADL Comments: Patient received up in chair; wife at bedside. Recently finished PT session. Educated on role of OT. Patient declined to practice toilet/tub transfers but verbal education on transfer techniques provided. Patient wants 3 in 1 for home; called case manager to inform her of that. Patient has no questions about getting dressed and wife to assist with that task. Patient reports nausea. RN made aware.     Vision     Perception     Praxis      Pertinent Vitals/Pain Pain Assessment: Faces Faces Pain Scale: Hurts a little bit Pain Descriptors / Indicators: Sore Pain Intervention(s): Limited activity within patient's tolerance;Monitored during session     Hand Dominance     Extremity/Trunk Assessment Upper  Extremity Assessment Upper Extremity Assessment: Overall WFL for tasks assessed   Lower Extremity Assessment Lower Extremity Assessment: Defer to PT evaluation       Communication Communication Communication: No difficulties   Cognition Arousal/Alertness: Awake/alert Behavior During Therapy: WFL for tasks assessed/performed Overall Cognitive Status: Within Functional Limits for tasks assessed                     General Comments       Exercises       Shoulder Instructions      Home Living Family/patient expects to be discharged to:: Private residence Living Arrangements: Spouse/significant other Available Help at Discharge: Family;Available 24 hours/day Type of Home: House       Home Layout: Two level;Able to live on main level with bedroom/bathroom     Bathroom Shower/Tub: Teacher, Jimmy Frazier years/pre: Standard Bathroom Accessibility: Yes How Accessible: Accessible via walker Home Equipment: None   Additional Comments: pt reports he will have L TKA in a few months      Prior Functioning/Environment Level of Independence: Independent             OT Diagnosis: Acute pain   OT Problem List: Decreased strength;Decreased range  of motion;Decreased activity tolerance;Decreased safety awareness;Decreased knowledge of use of DME or AE;Pain   OT Treatment/Interventions:      OT Goals(Current goals can be found in the care plan section) Acute Rehab OT Goals Patient Stated Goal: home today OT Goal Formulation: All assessment and education complete, DC therapy  OT Frequency:     Barriers to D/C:            Co-evaluation              End of Session Nurse Communication: Other (comment) (pt nauseated)  Activity Tolerance: Patient tolerated treatment well Patient left: in chair;with call bell/phone within reach;with family/visitor present   Time: 1138-1150 OT Time Calculation (min): 12 min Charges:  OT General Charges $OT Visit: 1  Procedure OT Evaluation $OT Eval Low Complexity: 1 Procedure G-Codes:    Jimmy Frazier A 07-Mar-2016, 12:36 PM

## 2016-02-13 NOTE — Progress Notes (Signed)
[facility]    [facility] is providing the following services: RW and commode  If patient discharges after hours, please call [phone].   Linward Headland 02/13/2016, 12:17 PM

## 2016-02-13 NOTE — Discharge Instructions (Signed)

## 2016-02-13 NOTE — Progress Notes (Signed)
Physical Therapy Treatment Note    02/13/16 1239  PT Visit Information  Last PT Received On 02/13/16  Assistance Needed +1  History of Present Illness s/p R TKA , has hx of L total shoulder replacement in 2014.   PT Time Calculation  PT Start Time (ACUTE ONLY) 1106  PT Stop Time (ACUTE ONLY) 1130  PT Time Calculation (min) (ACUTE ONLY) 24 min  Subjective Data  Subjective Pt ambulated in hallway and performed LE exercises.  Pt wishes to have one more session prior to d/c home today.  Precautions  Precautions Knee  Restrictions  Other Position/Activity Restrictions WBAT  Pain Assessment  Pain Assessment 0-10  Pain Score 2  Pain Location R knee  Pain Descriptors / Indicators Sore;Tightness  Pain Intervention(s) Limited activity within patient's tolerance;Monitored during session;Ice applied;Repositioned  Cognition  Arousal/Alertness Awake/alert  Behavior During Therapy WFL for tasks assessed/performed  Overall Cognitive Status Within Functional Limits for tasks assessed  Bed Mobility  Overal bed mobility Needs Assistance  Bed Mobility Supine to Sit  Supine to sit Min assist  General bed mobility comments assist for R LE  Transfers  Overall transfer level Needs assistance  Equipment used Rolling walker (2 wheeled)  Transfers Sit to/from Stand  Sit to Stand Min guard  General transfer comment verbal cues for UE and LE positioning  Ambulation/Gait  Ambulation/Gait assistance Min guard  Ambulation Distance (Feet) 200 Feet  Assistive device Rolling walker (2 wheeled)  Gait Pattern/deviations Step-to pattern;Antalgic  General Gait Details pt denies dizziness today and tolerated good distance, decreased heel strike and increased knee flexion observed during stance  Exercises  Exercises Total Joint  Total Joint Exercises  Ankle Circles/Pumps AROM;10 reps;Both  Quad Sets AROM;Both;10 reps  Heel Slides AAROM;Right;10 reps;Seated  Hip ABduction/ADduction AAROM;Right;5 reps   Straight Leg Raises AAROM;Right;10 reps  Goniometric ROM 10-80  Short Arc Quad AROM;Right;10 reps  PT - End of Session  Activity Tolerance Patient tolerated treatment well  Patient left in chair;with call bell/phone within reach;with family/visitor present  PT - Assessment/Plan  PT Plan Current plan remains appropriate  PT Frequency (ACUTE ONLY) 7X/week  Follow Up Recommendations Home health PT  PT equipment Rolling walker with 5" wheels  PT Goal Progression  Progress towards PT goals Progressing toward goals  PT General Charges  $$ ACUTE PT VISIT 1 Procedure  PT Treatments  $Gait Training 8-22 mins  $Therapeutic Exercise 8-22 mins   Carmelia Bake, PT, DPT 02/13/2016 Pager: [phone]

## 2016-02-13 NOTE — Care Management Note (Signed)
Case Management Note  Patient Details  Name: Jimmy Frazier MRN: [id number] Date of Birth: 1951/09/26  Subjective/Objective:                   TOTAL RIGHT KNEE ARTHROPLASTY,INTRARTICULAR INJECTION LEFT KNEE (Right) Action/Plan:  Discharge planning Expected Discharge Date:  02/13/16             Expected Discharge Plan:  [place]  In-House Referral:     Discharge planning Services  CM Consult  Post Acute Care Choice:    Choice offered to:     DME Arranged:  3-N-1 DME Agency:  [place]:  PT [facility] Agency:  [place]  Status of Service:  Completed, signed off  Medicare Important Message Given:    Date Medicare IM Given:    Medicare IM give by:    Date Additional Medicare IM Given:    Additional Medicare Important Message give by:     If discussed at [place] of Stay Meetings, dates discussed:    Additional Comments: CM met with pt in room to offer choice of home health agency.  Pt states Pre-surgical arrangement of [name] is fine.  CM called [facility] DME rep, Lecretia to please deliver the 3n1 to room prior to discharge.  CM received call post discharge from [place], Octavia Bruckner stating they could NOT honor offer of rendering [name].  [name] states he has arranged for HHPT to be through [facility]. CM called [facility] rep, Santiago Glad to confirm; CM called pt to make aware.  No other CM needs were communicated.  Dellie Catholic, RN 02/13/2016, 3:45 PM

## 2016-02-13 NOTE — Progress Notes (Signed)
Physical Therapy Treatment Note    02/13/16 1600  PT Visit Information  Last PT Received On 02/13/16  Assistance Needed +1  History of Present Illness s/p R TKA , has hx of L total shoulder replacement in 2014.   PT Time Calculation  PT Start Time (ACUTE ONLY) 1409  PT Stop Time (ACUTE ONLY) 1422  PT Time Calculation (min) (ACUTE ONLY) 13 min  Subjective Data  Subjective Pt ambulated in hallway and reports slight nausea since previous session.  Answered pt and spouses questions and pt feels ready to d/c home today.  Precautions  Precautions Knee  Restrictions  Other Position/Activity Restrictions WBAT  Pain Assessment  Pain Assessment 0-10  Pain Score 4  Pain Location R knee  Pain Descriptors / Indicators Sore;Tightness  Pain Intervention(s) Limited activity within patient's tolerance;Monitored during session;Repositioned  Cognition  Arousal/Alertness Awake/alert  Behavior During Therapy WFL for tasks assessed/performed  Overall Cognitive Status Within Functional Limits for tasks assessed  Bed Mobility  General bed mobility comments pt up in recliner  Transfers  Overall transfer level Needs assistance  Equipment used Rolling walker (2 wheeled)  Sit to Stand Supervision  General transfer comment performed with safe technique  Ambulation/Gait  Ambulation/Gait assistance Supervision  Ambulation Distance (Feet) 100 Feet  Assistive device Rolling walker (2 wheeled)  Gait Pattern/deviations Step-through pattern;Decreased stance time - right;Antalgic  General Gait Details better heel strike and improved gait pattern this afternoon  PT - End of Session  Activity Tolerance Patient tolerated treatment well  Patient left in chair;with call bell/phone within reach;with family/visitor present  PT - Assessment/Plan  PT Plan Current plan remains appropriate  PT Frequency (ACUTE ONLY) 7X/week  Follow Up Recommendations Home health PT  PT equipment Rolling walker with 5" wheels  PT  Goal Progression  Progress towards PT goals Progressing toward goals  PT General Charges  $$ ACUTE PT VISIT 1 Procedure  PT Treatments  $Gait Training 8-22 mins   Carmelia Bake, PT, DPT 02/13/2016 Pager: [phone]

## 2016-02-13 NOTE — Progress Notes (Signed)
     Subjective: 1 Day Post-Op Procedure(s) (LRB): TOTAL RIGHT KNEE ARTHROPLASTY,INTRARTICULAR INJECTION LEFT KNEE (Right)   Patient reports pain as mild, pain controlled. No events throughout the night. Ready to be discharged home if he does well with PT.   Objective:   VITALS:   Filed Vitals:   02/13/16 0210 02/13/16 0533  BP: 129/83 118/78  Pulse: 87 76  Temp: 98 F (36.7 C) 98 F (36.7 C)  Resp: 16 16    Dorsiflexion/Plantar flexion intact Incision: dressing C/D/I No cellulitis present Compartment soft  LABS  Recent Labs  02/13/16 0354  HGB 12.7*  HCT 36.7*  WBC 14.5*  PLT 195     Recent Labs  02/13/16 0354  NA 137  K 4.8  BUN 30*  CREATININE 1.60*  GLUCOSE 175*     Assessment/Plan: 1 Day Post-Op Procedure(s) (LRB): TOTAL RIGHT KNEE ARTHROPLASTY,INTRARTICULAR INJECTION LEFT KNEE (Right) Foley cath d/c'ed Advance diet Up with therapy D/C IV fluids Discharge home with home health  Follow up in 2 [facility] at [facility]. Follow up with OLIN,Rylynne Schicker D in 2 weeks.  Contact information:  [facility] [address] [place] [id number]    Obese (BMI 30-39.9) Estimated body mass index is 32.14 kg/(m^2) as calculated from the following:   Height as of this encounter: 5\' 10"  (1.778 m).   Weight as of this encounter: 101.606 kg (224 lb). Patient also counseled that weight may inhibit the healing process Patient counseled that losing weight will help with future health issues        West Pugh. Hoorain Kozakiewicz   PAC  02/13/2016, 8:30 AM

## 2016-02-14 DIAGNOSIS — I10 Essential (primary) hypertension: Secondary | ICD-10-CM | POA: Diagnosis not present

## 2016-02-14 DIAGNOSIS — Z96651 Presence of right artificial knee joint: Secondary | ICD-10-CM | POA: Diagnosis not present

## 2016-02-14 DIAGNOSIS — M1990 Unspecified osteoarthritis, unspecified site: Secondary | ICD-10-CM | POA: Diagnosis not present

## 2016-02-14 DIAGNOSIS — E785 Hyperlipidemia, unspecified: Secondary | ICD-10-CM | POA: Diagnosis not present

## 2016-02-14 DIAGNOSIS — Z7982 Long term (current) use of aspirin: Secondary | ICD-10-CM | POA: Diagnosis not present

## 2016-02-14 DIAGNOSIS — Z471 Aftercare following joint replacement surgery: Secondary | ICD-10-CM | POA: Diagnosis not present

## 2016-02-14 DIAGNOSIS — E119 Type 2 diabetes mellitus without complications: Secondary | ICD-10-CM | POA: Diagnosis not present

## 2016-02-14 DIAGNOSIS — M549 Dorsalgia, unspecified: Secondary | ICD-10-CM | POA: Diagnosis not present

## 2016-02-14 DIAGNOSIS — K219 Gastro-esophageal reflux disease without esophagitis: Secondary | ICD-10-CM | POA: Diagnosis not present

## 2016-02-16 DIAGNOSIS — E785 Hyperlipidemia, unspecified: Secondary | ICD-10-CM | POA: Diagnosis not present

## 2016-02-16 DIAGNOSIS — Z96651 Presence of right artificial knee joint: Secondary | ICD-10-CM | POA: Diagnosis not present

## 2016-02-16 DIAGNOSIS — Z7982 Long term (current) use of aspirin: Secondary | ICD-10-CM | POA: Diagnosis not present

## 2016-02-16 DIAGNOSIS — Z471 Aftercare following joint replacement surgery: Secondary | ICD-10-CM | POA: Diagnosis not present

## 2016-02-16 DIAGNOSIS — M1990 Unspecified osteoarthritis, unspecified site: Secondary | ICD-10-CM | POA: Diagnosis not present

## 2016-02-16 DIAGNOSIS — M549 Dorsalgia, unspecified: Secondary | ICD-10-CM | POA: Diagnosis not present

## 2016-02-16 DIAGNOSIS — E119 Type 2 diabetes mellitus without complications: Secondary | ICD-10-CM | POA: Diagnosis not present

## 2016-02-16 DIAGNOSIS — I10 Essential (primary) hypertension: Secondary | ICD-10-CM | POA: Diagnosis not present

## 2016-02-16 DIAGNOSIS — K219 Gastro-esophageal reflux disease without esophagitis: Secondary | ICD-10-CM | POA: Diagnosis not present

## 2016-02-18 DIAGNOSIS — K219 Gastro-esophageal reflux disease without esophagitis: Secondary | ICD-10-CM | POA: Diagnosis not present

## 2016-02-18 DIAGNOSIS — M1990 Unspecified osteoarthritis, unspecified site: Secondary | ICD-10-CM | POA: Diagnosis not present

## 2016-02-18 DIAGNOSIS — E119 Type 2 diabetes mellitus without complications: Secondary | ICD-10-CM | POA: Diagnosis not present

## 2016-02-18 DIAGNOSIS — Z7982 Long term (current) use of aspirin: Secondary | ICD-10-CM | POA: Diagnosis not present

## 2016-02-18 DIAGNOSIS — M549 Dorsalgia, unspecified: Secondary | ICD-10-CM | POA: Diagnosis not present

## 2016-02-18 DIAGNOSIS — Z96651 Presence of right artificial knee joint: Secondary | ICD-10-CM | POA: Diagnosis not present

## 2016-02-18 DIAGNOSIS — Z471 Aftercare following joint replacement surgery: Secondary | ICD-10-CM | POA: Diagnosis not present

## 2016-02-18 DIAGNOSIS — I10 Essential (primary) hypertension: Secondary | ICD-10-CM | POA: Diagnosis not present

## 2016-02-18 DIAGNOSIS — E785 Hyperlipidemia, unspecified: Secondary | ICD-10-CM | POA: Diagnosis not present

## 2016-02-18 NOTE — Discharge Summary (Signed)
Physician Discharge Summary  Patient ID: Jimmy Frazier MRN: [id number] DOB/AGE: 1951/06/03 65 y.o.  Admit date: 02/12/2016 Discharge date: 02/13/2016   Procedures:  Procedure(s) (LRB): TOTAL RIGHT KNEE ARTHROPLASTY,INTRARTICULAR INJECTION LEFT KNEE (Right)  Attending Physician:  Dr. Paralee Cancel   Admission Diagnoses:   Right knee primary OA / pain  Discharge Diagnoses:  Principal Problem:   S/P right TKA Active Problems:   Obese  Past Medical History  Diagnosis Date  . Hypertension     takes Prinizide and Felodipine daily  . Hyperlipidemia     but doesn't require meds  . Muscle spasm     Robaxin daily prn  . Back pain     occasionally  . Joint pain   . Joint swelling     knees only  . GERD (gastroesophageal reflux disease)     takes Omeprazole daily prn  . Diverticulosis   . Diabetes mellitus without complication ([place])     borderline;but no meds required yet;diet and exercise  . Headache(784.0)     hx of cluster HA;but none in a while,02-05-16 no longer a problem  . Arthritis     osteoarthritis    HPI:    Jimmy Frazier, 65 y.o. male, has a history of pain and functional disability in the right knee due to arthritis and has failed non-surgical conservative treatments for greater than 12 weeks to include NSAID's and/or analgesics, corticosteriod injections, viscosupplementation injections and activity modification. Onset of symptoms was gradual, starting >10 years ago with gradually worsening course since that time. The patient noted prior procedures on the knee to include arthroscopy on the right knee(s). Patient currently rates pain in the right knee(s) at 10 out of 10 with activity. Patient has night pain, worsening of pain with activity and weight bearing, pain that interferes with activities of daily living, pain with passive range of motion, crepitus and joint swelling. Patient has evidence of periarticular osteophytes and joint space narrowing by imaging  studies. There is no active infection. Risks, benefits and expectations were discussed with the patient. Risks including but not limited to the risk of anesthesia, blood clots, nerve damage, blood vessel damage, failure of the prosthesis, infection and up to and including death. Patient understand the risks, benefits and expectations and wishes to proceed with surgery.   PCP: Wonda Cerise, MD   Discharged Condition: good  Hospital Course:  Patient underwent the above stated procedure on 02/12/2016. Patient tolerated the procedure well and brought to the recovery room in good condition and subsequently to the floor.  POD #1 BP: 118/78 ; Pulse: 76 ; Temp: 98 F (36.7 C) ; Resp: 16 Patient reports pain as mild, pain controlled. No events throughout the night. Ready to be discharged home. Dorsiflexion/plantar flexion intact, incision: dressing C/D/I, no cellulitis present and compartment soft.   LABS  Basename    HGB     12.7  HCT     36.7    Discharge Exam: General appearance: alert, cooperative and no distress Extremities: Homans sign is negative, no sign of DVT, no edema, redness or tenderness in the calves or thighs and no ulcers, gangrene or trophic changes  Disposition: Home with follow up in 2 weeks   Follow-up Information    Follow up with Mauri Pole, MD. Schedule an appointment as soon as possible for a visit in 2 weeks.   Specialty:  Orthopedic Surgery   Contact information:   [address] [place] [address] [phone]  Follow up with [facility].   Why:  home health physical therapy   Contact information:   [address] [place] [address] [phone]       Discharge Instructions    Call MD / Call 911    Complete by:  As directed   If you experience chest pain or shortness of breath, CALL 911 and be transported to the hospital emergency room.  If you develope a fever above 101 F, pus (white  drainage) or increased drainage or redness at the wound, or calf pain, call your surgeon's office.     Change dressing    Complete by:  As directed   Maintain surgical dressing until follow up in the clinic. If the edges start to pull up, may reinforce with tape. If the dressing is no longer working, may remove and cover with gauze and tape, but must keep the area dry and clean.  Call with any questions or concerns.     Constipation Prevention    Complete by:  As directed   Drink plenty of fluids.  Prune juice may be helpful.  You may use a stool softener, such as Colace (over the counter) 100 mg twice a day.  Use MiraLax (over the counter) for constipation as needed.     Diet - low sodium heart healthy    Complete by:  As directed      Discharge instructions    Complete by:  As directed   Maintain surgical dressing until follow up in the clinic. If the edges start to pull up, may reinforce with tape. If the dressing is no longer working, may remove and cover with gauze and tape, but must keep the area dry and clean.  Follow up in 2 weeks at [facility] Call with any questions or concerns.     Increase activity slowly as tolerated    Complete by:  As directed   Weight bearing as tolerated with assist device (walker, cane, etc) as directed, use it as long as suggested by your surgeon or therapist, typically at least 4-6 weeks.     TED hose    Complete by:  As directed   Use stockings (TED hose) for 2 weeks on both leg(s).  You may remove them at night for sleeping.             Medication List    STOP taking these medications        ADVIL 200 MG tablet  Generic drug:  ibuprofen     aspirin 81 MG tablet  Replaced by:  aspirin 325 MG EC tablet     diclofenac sodium 1 % Gel  Commonly known as:  VOLTAREN     oxyCODONE-acetaminophen 5-325 MG tablet  Commonly known as:  ROXICET      TAKE these medications        aspirin 325 MG EC tablet  Take 1 tablet (325 mg total)  by mouth 2 (two) times daily.     celecoxib 200 MG capsule  Commonly known as:  CELEBREX  Take 1 capsule (200 mg total) by mouth every 12 (twelve) hours.     docusate sodium 100 MG capsule  Commonly known as:  COLACE  Take 1 capsule (100 mg total) by mouth 2 (two) times daily.     felodipine 10 MG 24 hr tablet  Commonly known as:  PLENDIL  Take 10 mg by mouth daily.     ferrous sulfate 325 (65  FE) MG tablet  Take 1 tablet (325 mg total) by mouth 3 (three) times daily after meals.     HYDROcodone-acetaminophen 7.5-325 MG tablet  Commonly known as:  NORCO  Take 1-2 tablets by mouth every 4 (four) hours as needed for moderate pain.     lisinopril-hydrochlorothiazide 20-12.5 MG tablet  Commonly known as:  PRINZIDE,ZESTORETIC  Take 1 tablet by mouth daily.     methocarbamol 500 MG tablet  Commonly known as:  ROBAXIN  Take 1 tablet (500 mg total) by mouth every 6 (six) hours as needed for muscle spasms.     omeprazole 20 MG capsule  Commonly known as:  PRILOSEC  Take 20 mg by mouth daily as needed (heart burn).     polyethylene glycol packet  Commonly known as:  MIRALAX / GLYCOLAX  Take 17 g by mouth 2 (two) times daily.     simvastatin 20 MG tablet  Commonly known as:  ZOCOR  Take 20 mg by mouth every morning.         Signed: [place] Pugh. Humza Tallerico   PA-C  02/18/2016, 12:44 PM

## 2016-02-20 DIAGNOSIS — I10 Essential (primary) hypertension: Secondary | ICD-10-CM | POA: Diagnosis not present

## 2016-02-20 DIAGNOSIS — Z96651 Presence of right artificial knee joint: Secondary | ICD-10-CM | POA: Diagnosis not present

## 2016-02-20 DIAGNOSIS — E785 Hyperlipidemia, unspecified: Secondary | ICD-10-CM | POA: Diagnosis not present

## 2016-02-20 DIAGNOSIS — Z471 Aftercare following joint replacement surgery: Secondary | ICD-10-CM | POA: Diagnosis not present

## 2016-02-20 DIAGNOSIS — M549 Dorsalgia, unspecified: Secondary | ICD-10-CM | POA: Diagnosis not present

## 2016-02-20 DIAGNOSIS — M1990 Unspecified osteoarthritis, unspecified site: Secondary | ICD-10-CM | POA: Diagnosis not present

## 2016-02-20 DIAGNOSIS — E119 Type 2 diabetes mellitus without complications: Secondary | ICD-10-CM | POA: Diagnosis not present

## 2016-02-20 DIAGNOSIS — K219 Gastro-esophageal reflux disease without esophagitis: Secondary | ICD-10-CM | POA: Diagnosis not present

## 2016-02-20 DIAGNOSIS — Z7982 Long term (current) use of aspirin: Secondary | ICD-10-CM | POA: Diagnosis not present

## 2016-02-22 DIAGNOSIS — Z7982 Long term (current) use of aspirin: Secondary | ICD-10-CM | POA: Diagnosis not present

## 2016-02-22 DIAGNOSIS — M1990 Unspecified osteoarthritis, unspecified site: Secondary | ICD-10-CM | POA: Diagnosis not present

## 2016-02-22 DIAGNOSIS — E119 Type 2 diabetes mellitus without complications: Secondary | ICD-10-CM | POA: Diagnosis not present

## 2016-02-22 DIAGNOSIS — Z471 Aftercare following joint replacement surgery: Secondary | ICD-10-CM | POA: Diagnosis not present

## 2016-02-22 DIAGNOSIS — K219 Gastro-esophageal reflux disease without esophagitis: Secondary | ICD-10-CM | POA: Diagnosis not present

## 2016-02-22 DIAGNOSIS — Z96651 Presence of right artificial knee joint: Secondary | ICD-10-CM | POA: Diagnosis not present

## 2016-02-22 DIAGNOSIS — M549 Dorsalgia, unspecified: Secondary | ICD-10-CM | POA: Diagnosis not present

## 2016-02-22 DIAGNOSIS — I10 Essential (primary) hypertension: Secondary | ICD-10-CM | POA: Diagnosis not present

## 2016-02-22 DIAGNOSIS — E785 Hyperlipidemia, unspecified: Secondary | ICD-10-CM | POA: Diagnosis not present

## 2016-02-25 DIAGNOSIS — M1990 Unspecified osteoarthritis, unspecified site: Secondary | ICD-10-CM | POA: Diagnosis not present

## 2016-02-25 DIAGNOSIS — E119 Type 2 diabetes mellitus without complications: Secondary | ICD-10-CM | POA: Diagnosis not present

## 2016-02-25 DIAGNOSIS — Z7982 Long term (current) use of aspirin: Secondary | ICD-10-CM | POA: Diagnosis not present

## 2016-02-25 DIAGNOSIS — Z96651 Presence of right artificial knee joint: Secondary | ICD-10-CM | POA: Diagnosis not present

## 2016-02-25 DIAGNOSIS — M549 Dorsalgia, unspecified: Secondary | ICD-10-CM | POA: Diagnosis not present

## 2016-02-25 DIAGNOSIS — E785 Hyperlipidemia, unspecified: Secondary | ICD-10-CM | POA: Diagnosis not present

## 2016-02-25 DIAGNOSIS — K219 Gastro-esophageal reflux disease without esophagitis: Secondary | ICD-10-CM | POA: Diagnosis not present

## 2016-02-25 DIAGNOSIS — I10 Essential (primary) hypertension: Secondary | ICD-10-CM | POA: Diagnosis not present

## 2016-02-25 DIAGNOSIS — Z471 Aftercare following joint replacement surgery: Secondary | ICD-10-CM | POA: Diagnosis not present

## 2016-02-27 DIAGNOSIS — M549 Dorsalgia, unspecified: Secondary | ICD-10-CM | POA: Diagnosis not present

## 2016-02-27 DIAGNOSIS — I10 Essential (primary) hypertension: Secondary | ICD-10-CM | POA: Diagnosis not present

## 2016-02-27 DIAGNOSIS — Z471 Aftercare following joint replacement surgery: Secondary | ICD-10-CM | POA: Diagnosis not present

## 2016-02-27 DIAGNOSIS — Z7982 Long term (current) use of aspirin: Secondary | ICD-10-CM | POA: Diagnosis not present

## 2016-02-27 DIAGNOSIS — K219 Gastro-esophageal reflux disease without esophagitis: Secondary | ICD-10-CM | POA: Diagnosis not present

## 2016-02-27 DIAGNOSIS — E785 Hyperlipidemia, unspecified: Secondary | ICD-10-CM | POA: Diagnosis not present

## 2016-02-27 DIAGNOSIS — M1990 Unspecified osteoarthritis, unspecified site: Secondary | ICD-10-CM | POA: Diagnosis not present

## 2016-02-27 DIAGNOSIS — E119 Type 2 diabetes mellitus without complications: Secondary | ICD-10-CM | POA: Diagnosis not present

## 2016-02-27 DIAGNOSIS — Z96651 Presence of right artificial knee joint: Secondary | ICD-10-CM | POA: Diagnosis not present

## 2016-03-04 DIAGNOSIS — Z471 Aftercare following joint replacement surgery: Secondary | ICD-10-CM | POA: Diagnosis not present

## 2016-03-04 DIAGNOSIS — M25361 Other instability, right knee: Secondary | ICD-10-CM | POA: Diagnosis not present

## 2016-03-04 DIAGNOSIS — M25561 Pain in right knee: Secondary | ICD-10-CM | POA: Diagnosis not present

## 2016-03-04 DIAGNOSIS — R2689 Other abnormalities of gait and mobility: Secondary | ICD-10-CM | POA: Diagnosis not present

## 2016-03-04 DIAGNOSIS — Z96659 Presence of unspecified artificial knee joint: Secondary | ICD-10-CM | POA: Diagnosis not present

## 2016-03-18 ENCOUNTER — Encounter (HOSPITAL_COMMUNITY): Payer: Self-pay

## 2016-03-18 ENCOUNTER — Encounter (INDEPENDENT_AMBULATORY_CARE_PROVIDER_SITE_OTHER): Payer: Self-pay

## 2016-03-18 ENCOUNTER — Encounter (HOSPITAL_COMMUNITY)
Admission: RE | Admit: 2016-03-18 | Discharge: 2016-03-18 | Disposition: A | Discharge status: Home / Self Care | Payer: PPO | Source: Ambulatory Visit | Attending: Orthopedic Surgery | Admitting: Orthopedic Surgery

## 2016-03-18 DIAGNOSIS — Z01812 Encounter for preprocedural laboratory examination: Secondary | ICD-10-CM | POA: Insufficient documentation

## 2016-03-18 DIAGNOSIS — M1712 Unilateral primary osteoarthritis, left knee: Secondary | ICD-10-CM | POA: Diagnosis not present

## 2016-03-18 DIAGNOSIS — M25562 Pain in left knee: Secondary | ICD-10-CM | POA: Diagnosis not present

## 2016-03-18 LAB — URINALYSIS, ROUTINE W REFLEX MICROSCOPIC
Bilirubin Urine: NEGATIVE
GLUCOSE, UA: NEGATIVE mg/dL
Hgb urine dipstick: NEGATIVE
Ketones, ur: NEGATIVE mg/dL
LEUKOCYTES UA: NEGATIVE
Nitrite: NEGATIVE
PROTEIN: NEGATIVE mg/dL
SPECIFIC GRAVITY, URINE: 1.006 (ref 1.005–1.030)
pH: 5 (ref 5.0–8.0)

## 2016-03-18 LAB — BASIC METABOLIC PANEL
Anion gap: 7 (ref 5–15)
BUN: 16 mg/dL (ref 6–20)
CALCIUM: 9.3 mg/dL (ref 8.9–10.3)
CO2: 25 mmol/L (ref 22–32)
CREATININE: 1.13 mg/dL (ref 0.61–1.24)
Chloride: 105 mmol/L (ref 101–111)
GFR calc Af Amer: >60 mL/min (ref >60)
GLUCOSE: 124 mg/dL — AB (ref 65–99)
Potassium: 4.3 mmol/L (ref 3.5–5.1)
SODIUM: 137 mmol/L (ref 135–145)

## 2016-03-18 LAB — CBC
HEMATOCRIT: 37.7 % — AB (ref 39.0–52.0)
Hemoglobin: 12.5 g/dL — ABNORMAL LOW (ref 13.0–17.0)
MCH: 27.7 pg (ref 26.0–34.0)
MCHC: 33.2 g/dL (ref 30.0–36.0)
MCV: 83.6 fL (ref 78.0–100.0)
PLATELETS: 246 10*3/uL (ref 150–400)
RBC: 4.51 MIL/uL (ref 4.22–5.81)
RDW: 13.8 % (ref 11.5–15.5)
WBC: 7.7 10*3/uL (ref 4.0–10.5)

## 2016-03-18 LAB — APTT: APTT: 32 s (ref 24–37)

## 2016-03-18 LAB — PROTIME-INR
INR: 1.06 (ref 0.00–1.49)
PROTHROMBIN TIME: 14 s (ref 11.6–15.2)

## 2016-03-18 LAB — SURGICAL PCR SCREEN
MRSA, PCR: NEGATIVE
STAPHYLOCOCCUS AUREUS: NEGATIVE

## 2016-03-18 NOTE — Patient Instructions (Signed)
ERASTO KUEHLER  03/18/2016   Your procedure is scheduled on: Tuesday 03/25/2016  Report to [facility]  Entrance take [place]  elevators to 3rd floor to  [place] at  [phone] AM.  Call this number if you have problems the morning of surgery [phone]   Remember: ONLY 1 PERSON MAY GO WITH YOU TO SHORT STAY TO GET  READY MORNING OF [place].   Do not eat food or drink liquids :After Midnight.     Take these medicines the morning of surgery with A SIP OF WATER: Omeprazole (Prilosec)                                You may not have any metal on your body including hair pins and              piercings  Do not wear jewelry, make-up, lotions, powders or perfumes, deodorant             Do not wear nail polish.  Do not shave  48 hours prior to surgery.              Men may shave face and neck.   Do not bring valuables to the hospital. [place].  Contacts, dentures or bridgework may not be worn into surgery.  Leave suitcase in the car. After surgery it may be brought to your room.                   Please read over the following fact sheets you were given: _____________________________________________________________________             [facility] - Preparing for Surgery Before surgery, you can play an important role.  Because skin is not sterile, your skin needs to be as free of germs as possible.  You can reduce the number of germs on your skin by washing with CHG (chlorahexidine gluconate) soap before surgery.  CHG is an antiseptic cleaner which kills germs and bonds with the skin to continue killing germs even after washing. Please DO NOT use if you have an allergy to CHG or antibacterial soaps.  If your skin becomes reddened/irritated stop using the CHG and inform your nurse when you arrive at Short Stay. Do not shave (including legs and underarms) for at least 48 hours prior to the first CHG  shower.  You may shave your face/neck. Please follow these instructions carefully:  1.  Shower with CHG Soap the night before surgery and the  morning of Surgery.  2.  If you choose to wash your hair, wash your hair first as usual with your  normal  shampoo.  3.  After you shampoo, rinse your hair and body thoroughly to remove the  shampoo.                           4.  Use CHG as you would any other liquid soap.  You can apply chg directly  to the skin and wash                       Gently with a scrungie or clean washcloth.  5.  Apply the CHG Soap to your body ONLY FROM THE NECK DOWN.   Do not use on face/ open                           Wound or open sores. Avoid contact with eyes, ears mouth and genitals (private parts).                       Wash face,  Genitals (private parts) with your normal soap.             6.  Wash thoroughly, paying special attention to the area where your surgery  will be performed.  7.  Thoroughly rinse your body with warm water from the neck down.  8.  DO NOT shower/wash with your normal soap after using and rinsing off  the CHG Soap.                9.  Pat yourself dry with a clean towel.            10.  Wear clean pajamas.            11.  Place clean sheets on your bed the night of your first shower and do not  sleep with pets. Day of Surgery : Do not apply any lotions/deodorants the morning of surgery.  Please wear clean clothes to the hospital/surgery center.  FAILURE TO FOLLOW THESE INSTRUCTIONS MAY RESULT IN THE CANCELLATION OF YOUR SURGERY PATIENT SIGNATURE_________________________________  NURSE SIGNATURE__________________________________  ________________________________________________________________________   [name]  An incentive spirometer is a tool that can help keep your lungs clear and active. This tool measures how well you are filling your lungs with each breath. Taking long deep breaths may help reverse or decrease the chance  of developing breathing (pulmonary) problems (especially infection) following:  A long period of time when you are unable to move or be active. BEFORE THE PROCEDURE   If the spirometer includes an indicator to show your best effort, your nurse or respiratory therapist will set it to a desired goal.  If possible, sit up straight or lean slightly forward. Try not to slouch.  Hold the incentive spirometer in an upright position. INSTRUCTIONS FOR USE   Sit on the edge of your bed if possible, or sit up as far as you can in bed or on a chair.  Hold the incentive spirometer in an upright position.  Breathe out normally.  Place the mouthpiece in your mouth and seal your lips tightly around it.  Breathe in slowly and as deeply as possible, raising the piston or the ball toward the top of the column.  Hold your breath for 3-5 seconds or for as long as possible. Allow the piston or ball to fall to the bottom of the column.  Remove the mouthpiece from your mouth and breathe out normally.  Rest for a few seconds and repeat Steps 1 through 7 at least 10 times every 1-2 hours when you are awake. Take your time and take a few normal breaths between deep breaths.  The spirometer may include an indicator to show your best effort. Use the indicator as a goal to work toward during each repetition.  After each set of 10 deep breaths, practice coughing to be sure your lungs are clear. If you have an incision (the cut made at the time of surgery), support your incision when coughing by placing a pillow or  rolled up towels firmly against it. Once you are able to get out of bed, walk around indoors and cough well. You may stop using the incentive spirometer when instructed by your caregiver.  RISKS AND COMPLICATIONS  Take your time so you do not get dizzy or light-headed.  If you are in pain, you may need to take or ask for pain medication before doing incentive spirometry. It is harder to take a deep  breath if you are having pain. AFTER USE  Rest and breathe slowly and easily.  It can be helpful to keep track of a log of your progress. Your caregiver can provide you with a simple table to help with this. If you are using the spirometer at home, follow these instructions: [place] IF:   You are having difficultly using the spirometer.  You have trouble using the spirometer as often as instructed.  Your pain medication is not giving enough relief while using the spirometer.  You develop fever of 100.5 F (38.1 C) or higher. SEEK IMMEDIATE MEDICAL CARE IF:   You cough up bloody sputum that had not been present before.  You develop fever of 102 F (38.9 C) or greater.  You develop worsening pain at or near the incision site. MAKE SURE YOU:   Understand these instructions.  Will watch your condition.  Will get help right away if you are not doing well or get worse. Document Released: 03/02/2007 Document Revised: 01/12/2012 Document Reviewed: 05/03/2007 ExitCare Patient Information 2014 ExitCare, [place].   ________________________________________________________________________  WHAT IS A BLOOD TRANSFUSION? Blood Transfusion Information  A transfusion is the replacement of blood or some of its parts. Blood is made up of multiple cells which provide different functions.  Red blood cells carry oxygen and are used for blood loss replacement.  White blood cells fight against infection.  Platelets control bleeding.  Plasma helps clot blood.  Other blood products are available for specialized needs, such as hemophilia or other clotting disorders. BEFORE THE TRANSFUSION  Who gives blood for transfusions?   Healthy volunteers who are fully evaluated to make sure their blood is safe. This is blood bank blood. Transfusion therapy is the safest it has ever been in the practice of medicine. Before blood is taken from a donor, a complete history is taken to make sure  that person has no history of diseases nor engages in risky social behavior (examples are intravenous drug use or sexual activity with multiple partners). The donor's travel history is screened to minimize risk of transmitting infections, such as malaria. The donated blood is tested for signs of infectious diseases, such as HIV and hepatitis. The blood is then tested to be sure it is compatible with you in order to minimize the chance of a transfusion reaction. If you or a relative donates blood, this is often done in anticipation of surgery and is not appropriate for emergency situations. It takes many days to process the donated blood. RISKS AND COMPLICATIONS Although transfusion therapy is very safe and saves many lives, the main dangers of transfusion include:   Getting an infectious disease.  Developing a transfusion reaction. This is an allergic reaction to something in the blood you were given. Every precaution is taken to prevent this. The decision to have a blood transfusion has been considered carefully by your caregiver before blood is given. Blood is not given unless the benefits outweigh the risks. AFTER THE TRANSFUSION  Right after receiving a blood transfusion, you will usually  feel much better and more energetic. This is especially true if your red blood cells have gotten low (anemic). The transfusion raises the level of the red blood cells which carry oxygen, and this usually causes an energy increase.  The nurse administering the transfusion will monitor you carefully for complications. HOME CARE INSTRUCTIONS  No special instructions are needed after a transfusion. You may find your energy is better. Speak with your caregiver about any limitations on activity for underlying diseases you may have. SEEK MEDICAL CARE IF:   Your condition is not improving after your transfusion.  You develop redness or irritation at the intravenous (IV) site. SEEK IMMEDIATE MEDICAL CARE IF:  Any of  the following symptoms occur over the next 12 hours:  Shaking chills.  You have a temperature by mouth above 102 F (38.9 C), not controlled by medicine.  Chest, back, or muscle pain.  People around you feel you are not acting correctly or are confused.  Shortness of breath or difficulty breathing.  Dizziness and fainting.  You get a rash or develop hives.  You have a decrease in urine output.  Your urine turns a dark color or changes to pink, red, or brown. Any of the following symptoms occur over the next 10 days:  You have a temperature by mouth above 102 F (38.9 C), not controlled by medicine.  Shortness of breath.  Weakness after normal activity.  The white part of the eye turns yellow (jaundice).  You have a decrease in the amount of urine or are urinating less often.  Your urine turns a dark color or changes to pink, red, or brown. Document Released: 10/17/2000 Document Revised: 01/12/2012 Document Reviewed: 06/05/2008 [facility] Patient Information 2014 [place], [place].  _______________________________________________________________________

## 2016-03-19 LAB — HEMOGLOBIN A1C
Hgb A1c MFr Bld: 6.6 % — ABNORMAL HIGH (ref 4.8–5.6)
MEAN PLASMA GLUCOSE: 143 mg/dL

## 2016-03-19 NOTE — Progress Notes (Signed)
02/05/2016- noted in [facility]

## 2016-03-21 DIAGNOSIS — Z96651 Presence of right artificial knee joint: Secondary | ICD-10-CM | POA: Diagnosis not present

## 2016-03-21 DIAGNOSIS — Z471 Aftercare following joint replacement surgery: Secondary | ICD-10-CM | POA: Diagnosis not present

## 2016-03-22 NOTE — H&P (Signed)
TOTAL KNEE ADMISSION H&P  Patient is being admitted for left total knee arthroplasty.  Subjective:  Chief Complaint:   Left knee primary OA / pain  HPI: Jimmy Frazier, 65 y.o. male, has a history of pain and functional disability in the left knee due to arthritis and has failed non-surgical conservative treatments for greater than 12 weeks to include NSAID's and/or analgesics, corticosteriod injections, viscosupplementation injections and activity modification.  Onset of symptoms was gradual, starting >10 years ago with gradually worsening course since that time. The patient noted prior procedures on the knee to include  arthroplasty on the right knee per Dr. Alvan Dame on 02/12/2016.  Patient currently rates pain in the left knee(s) at 9 out of 10 with activity. Patient has worsening of pain with activity and weight bearing, pain that interferes with activities of daily living, pain with passive range of motion, crepitus and joint swelling.  Patient has evidence of periarticular osteophytes and joint space narrowing by imaging studies.  There is no active infection.  Risks, benefits and expectations were discussed with the patient.  Risks including but not limited to the risk of anesthesia, blood clots, nerve damage, blood vessel damage, failure of the prosthesis, infection and up to and including death.  Patient understand the risks, benefits and expectations and wishes to proceed with surgery.   PCP: Wonda Cerise, MD  D/C Plans:      Home with HHPT  Post-op Meds:       No Rx given  Tranexamic Acid:      To be given - IV   Decadron:      Is to be given  FYI:     ASA  Norco    Patient Active Problem List   Diagnosis Date Noted  . Obese 02/13/2016  . S/P right TKA 02/12/2016   Past Medical History  Diagnosis Date  . Hypertension     takes Prinizide and Felodipine daily  . Hyperlipidemia     but doesn't require meds  . Muscle spasm     Robaxin daily prn  . Back pain    occasionally  . Joint pain   . Joint swelling     knees only  . GERD (gastroesophageal reflux disease)     takes Omeprazole daily prn  . Diverticulosis   . Diabetes mellitus without complication ([place])     borderline;but no meds required yet;diet and exercise  . Headache(784.0)     hx of cluster HA;but none in a while,02-05-16 no longer a problem  . Arthritis     osteoarthritis    Past Surgical History  Procedure Laterality Date  . Knee arthroscopy      2 on right and 1 on left  . Right shoulder arthroscopy    . Esophagogastroduodenoscopy    . Colonoscopy    . Total shoulder arthroplasty Left 02/25/2013    Procedure: LEFT TOTAL SHOULDER ARTHROPLASTY ;  Surgeon: Augustin Schooling, MD;  Location: [facility];  Service: Orthopedics;  Laterality: Left;  . Total knee arthroplasty Right 02/12/2016    Procedure: TOTAL RIGHT KNEE ARTHROPLASTY,INTRARTICULAR INJECTION LEFT KNEE;  Surgeon: Paralee Cancel, MD;  Location: [facility];  Service: Orthopedics;  Laterality: Right;    No prescriptions prior to admission   No Known Allergies   Social History  Substance Use Topics  . Smoking status: Never Smoker   . Smokeless tobacco: Not on file  . Alcohol Use: Yes     Comment: rarely  Review of Systems  Constitutional: Negative.   Eyes: Negative.   Respiratory: Negative.   Cardiovascular: Negative.   Gastrointestinal: Positive for heartburn.  Genitourinary: Positive for frequency.  Musculoskeletal: Positive for back pain and joint pain.  Skin: Negative.   Neurological: Positive for headaches.  Endo/Heme/Allergies: Negative.   Psychiatric/Behavioral: Negative.     Objective:  Physical Exam  Constitutional: He is oriented to person, place, and time. He appears well-developed.  HENT:  Head: Normocephalic.  Eyes: Pupils are equal, round, and reactive to light.  Neck: Neck supple. No JVD present. No tracheal deviation present. No thyromegaly present.  Cardiovascular: Normal rate, regular  rhythm, normal heart sounds and intact distal pulses.   Respiratory: Effort normal and breath sounds normal. No stridor. No respiratory distress. He has no wheezes.  GI: Soft. There is no tenderness. There is no guarding.  Musculoskeletal:       Left knee: He exhibits decreased range of motion, swelling and bony tenderness. He exhibits no ecchymosis, no deformity, no laceration and no erythema. Tenderness found.  Lymphadenopathy:    He has no cervical adenopathy.  Neurological: He is alert and oriented to person, place, and time.  Skin: Skin is warm and dry.  Psychiatric: He has a normal mood and affect.      Labs:  Estimated body mass index is 32.14 kg/(m^2) as calculated from the following:   Height as of 02/18/13: 5\' 10"  (1.778 m).   Weight as of 02/12/16: 101.606 kg (224 lb).   Imaging Review Plain radiographs demonstrate severe degenerative joint disease of the left knee(s). The bone quality appears to be good for age and reported activity level.  Assessment/Plan:  End stage arthritis, left knee   The patient history, physical examination, clinical judgment of the provider and imaging studies are consistent with end stage degenerative joint disease of the left knee(s) and total knee arthroplasty is deemed medically necessary. The treatment options including medical management, injection therapy arthroscopy and arthroplasty were discussed at length. The risks and benefits of total knee arthroplasty were presented and reviewed. The risks due to aseptic loosening, infection, stiffness, patella tracking problems, thromboembolic complications and other imponderables were discussed. The patient acknowledged the explanation, agreed to proceed with the plan and consent was signed. Patient is being admitted for inpatient treatment for surgery, pain control, PT, OT, prophylactic antibiotics, VTE prophylaxis, progressive ambulation and ADL's and discharge planning. The patient is planning to be  discharged home with home health services.     [place] Pennie Vanblarcom   PA-C  03/22/2016, 2:07 PM

## 2016-03-25 ENCOUNTER — Encounter (HOSPITAL_COMMUNITY)
Admission: RE | Disposition: A | Discharge status: Home Health | Payer: Self-pay | Source: Ambulatory Visit | Attending: Orthopedic Surgery

## 2016-03-25 ENCOUNTER — Inpatient Hospital Stay (HOSPITAL_COMMUNITY)
Admission: RE | Admit: 2016-03-25 | Discharge: 2016-03-26 | DRG: 470 | Disposition: A | Discharge status: Home Health | Payer: PPO | Source: Ambulatory Visit | Attending: Orthopedic Surgery | Admitting: Orthopedic Surgery

## 2016-03-25 ENCOUNTER — Encounter (HOSPITAL_COMMUNITY): Payer: Self-pay | Admitting: *Deleted

## 2016-03-25 ENCOUNTER — Inpatient Hospital Stay (HOSPITAL_COMMUNITY): Payer: PPO | Admitting: Certified Registered Nurse Anesthetist

## 2016-03-25 DIAGNOSIS — E119 Type 2 diabetes mellitus without complications: Secondary | ICD-10-CM | POA: Diagnosis not present

## 2016-03-25 DIAGNOSIS — E669 Obesity, unspecified: Secondary | ICD-10-CM | POA: Diagnosis present

## 2016-03-25 DIAGNOSIS — M659 Synovitis and tenosynovitis, unspecified: Secondary | ICD-10-CM | POA: Diagnosis present

## 2016-03-25 DIAGNOSIS — Z96651 Presence of right artificial knee joint: Secondary | ICD-10-CM | POA: Diagnosis present

## 2016-03-25 DIAGNOSIS — K219 Gastro-esophageal reflux disease without esophagitis: Secondary | ICD-10-CM | POA: Diagnosis present

## 2016-03-25 DIAGNOSIS — Z6832 Body mass index (BMI) 32.0-32.9, adult: Secondary | ICD-10-CM | POA: Diagnosis not present

## 2016-03-25 DIAGNOSIS — M1712 Unilateral primary osteoarthritis, left knee: Secondary | ICD-10-CM | POA: Diagnosis not present

## 2016-03-25 DIAGNOSIS — Z96612 Presence of left artificial shoulder joint: Secondary | ICD-10-CM | POA: Diagnosis not present

## 2016-03-25 DIAGNOSIS — Z96659 Presence of unspecified artificial knee joint: Secondary | ICD-10-CM

## 2016-03-25 DIAGNOSIS — I10 Essential (primary) hypertension: Secondary | ICD-10-CM | POA: Diagnosis not present

## 2016-03-25 DIAGNOSIS — Z96652 Presence of left artificial knee joint: Secondary | ICD-10-CM

## 2016-03-25 DIAGNOSIS — M25562 Pain in left knee: Secondary | ICD-10-CM | POA: Diagnosis not present

## 2016-03-25 DIAGNOSIS — M179 Osteoarthritis of knee, unspecified: Secondary | ICD-10-CM | POA: Diagnosis not present

## 2016-03-25 HISTORY — PX: TOTAL KNEE ARTHROPLASTY: SHX125

## 2016-03-25 LAB — GLUCOSE, CAPILLARY
Glucose-Capillary: 125 mg/dL — ABNORMAL HIGH (ref 65–99)
Glucose-Capillary: 149 mg/dL — ABNORMAL HIGH (ref 65–99)
Glucose-Capillary: 185 mg/dL — ABNORMAL HIGH (ref 65–99)

## 2016-03-25 LAB — TYPE AND SCREEN
ABO/RH(D): O POS
Antibody Screen: NEGATIVE

## 2016-03-25 SURGERY — ARTHROPLASTY, KNEE, TOTAL
Anesthesia: Spinal | Site: Knee | Laterality: Left

## 2016-03-25 MED ORDER — FENTANYL CITRATE (PF) 100 MCG/2ML IJ SOLN
INTRAMUSCULAR | Status: DC | PRN
  Administered 2016-03-25 (×4): 50 ug via INTRAVENOUS

## 2016-03-25 MED ORDER — ONDANSETRON HCL 4 MG/2ML IJ SOLN
INTRAMUSCULAR | Status: DC | PRN
  Administered 2016-03-25 (×2): 2 mg via INTRAVENOUS

## 2016-03-25 MED ORDER — KETOROLAC TROMETHAMINE 30 MG/ML IJ SOLN
INTRAMUSCULAR | Status: DC | PRN
  Administered 2016-03-25: 30 mg

## 2016-03-25 MED ORDER — MIDAZOLAM HCL 5 MG/5ML IJ SOLN
INTRAMUSCULAR | Status: DC | PRN
  Administered 2016-03-25 (×3): 1 mg via INTRAVENOUS
  Administered 2016-03-25 (×2): 0.5 mg via INTRAVENOUS

## 2016-03-25 MED ORDER — BUPIVACAINE-EPINEPHRINE (PF) 0.25% -1:200000 IJ SOLN
INTRAMUSCULAR | Status: DC | PRN
  Administered 2016-03-25: 30 mL

## 2016-03-25 MED ORDER — KETOROLAC TROMETHAMINE 30 MG/ML IJ SOLN
INTRAMUSCULAR | Status: AC
  Filled 2016-03-25: qty 1

## 2016-03-25 MED ORDER — FENTANYL CITRATE (PF) 100 MCG/2ML IJ SOLN
INTRAMUSCULAR | Status: AC
  Filled 2016-03-25: qty 2

## 2016-03-25 MED ORDER — MENTHOL 3 MG MT LOZG: 1.0000 | LOZENGE | OROMUCOSAL | Status: DC | PRN

## 2016-03-25 MED ORDER — PROPOFOL 10 MG/ML IV BOLUS
INTRAVENOUS | Status: AC
  Filled 2016-03-25: qty 20

## 2016-03-25 MED ORDER — METHOCARBAMOL 1000 MG/10ML IJ SOLN
500.0000 mg | Freq: Four times a day (QID) | INTRAVENOUS | Status: DC | PRN
  Administered 2016-03-25: 500 mg via INTRAVENOUS
  Filled 2016-03-25: qty 550
  Filled 2016-03-25: qty 5

## 2016-03-25 MED ORDER — DEXAMETHASONE SODIUM PHOSPHATE 10 MG/ML IJ SOLN
INTRAMUSCULAR | Status: AC
  Filled 2016-03-25: qty 1

## 2016-03-25 MED ORDER — POLYETHYLENE GLYCOL 3350 17 G PO PACK
17.0000 g | PACK | Freq: Two times a day (BID) | ORAL | Status: DC
  Administered 2016-03-26: 17 g via ORAL
  Filled 2016-03-25: qty 1

## 2016-03-25 MED ORDER — CEFAZOLIN SODIUM-DEXTROSE 2-4 GM/100ML-% IV SOLN
INTRAVENOUS | Status: AC
  Filled 2016-03-25: qty 100

## 2016-03-25 MED ORDER — METOCLOPRAMIDE HCL 5 MG PO TABS: 5.0000 mg | ORAL_TABLET | Freq: Three times a day (TID) | ORAL | Status: DC | PRN

## 2016-03-25 MED ORDER — ASPIRIN EC 325 MG PO TBEC
325.0000 mg | DELAYED_RELEASE_TABLET | Freq: Two times a day (BID) | ORAL | Status: DC
  Administered 2016-03-26: 325 mg via ORAL
  Filled 2016-03-25: qty 1

## 2016-03-25 MED ORDER — PHENOL 1.4 % MT LIQD: 1.0000 | OROMUCOSAL | Status: DC | PRN

## 2016-03-25 MED ORDER — TRANEXAMIC ACID 1000 MG/10ML IV SOLN
1000.0000 mg | Freq: Once | INTRAVENOUS | Status: AC
  Administered 2016-03-25: 1000 mg via INTRAVENOUS
  Filled 2016-03-25: qty 10

## 2016-03-25 MED ORDER — PROPOFOL 10 MG/ML IV BOLUS
INTRAVENOUS | Status: AC
  Filled 2016-03-25: qty 40

## 2016-03-25 MED ORDER — ONDANSETRON HCL 4 MG/2ML IJ SOLN
INTRAMUSCULAR | Status: AC
  Filled 2016-03-25: qty 2

## 2016-03-25 MED ORDER — LACTATED RINGERS IV SOLN
INTRAVENOUS | Status: DC
  Administered 2016-03-25: 1000 mL via INTRAVENOUS

## 2016-03-25 MED ORDER — PANTOPRAZOLE SODIUM 40 MG PO TBEC: 40.0000 mg | DELAYED_RELEASE_TABLET | Freq: Every day | ORAL | Status: DC | PRN

## 2016-03-25 MED ORDER — HYDROCODONE-ACETAMINOPHEN 7.5-325 MG PO TABS
1.0000 | ORAL_TABLET | ORAL | Status: DC
  Administered 2016-03-25: 1 via ORAL
  Administered 2016-03-25: 2 via ORAL
  Administered 2016-03-26: 1 via ORAL
  Administered 2016-03-26 (×2): 2 via ORAL
  Administered 2016-03-26: 1 via ORAL
  Administered 2016-03-26: 2 via ORAL
  Filled 2016-03-25: qty 2
  Filled 2016-03-25: qty 1
  Filled 2016-03-25: qty 2
  Filled 2016-03-25: qty 1
  Filled 2016-03-25 (×3): qty 2

## 2016-03-25 MED ORDER — DEXAMETHASONE SODIUM PHOSPHATE 10 MG/ML IJ SOLN
10.0000 mg | Freq: Once | INTRAMUSCULAR | Status: AC
  Administered 2016-03-25: 10 mg via INTRAVENOUS

## 2016-03-25 MED ORDER — DEXAMETHASONE SODIUM PHOSPHATE 4 MG/ML IJ SOLN: INTRAMUSCULAR | Status: DC | PRN

## 2016-03-25 MED ORDER — ALUM & MAG HYDROXIDE-SIMETH 200-200-20 MG/5ML PO SUSP: 30.0000 mL | ORAL | Status: DC | PRN

## 2016-03-25 MED ORDER — HYDROMORPHONE HCL 1 MG/ML IJ SOLN
0.5000 mg | INTRAMUSCULAR | Status: DC | PRN
  Administered 2016-03-25: 1 mg via INTRAVENOUS
  Filled 2016-03-25: qty 1

## 2016-03-25 MED ORDER — BUPIVACAINE IN DEXTROSE 0.75-8.25 % IT SOLN
INTRATHECAL | Status: DC | PRN
  Administered 2016-03-25: 2 mL via INTRATHECAL

## 2016-03-25 MED ORDER — MIDAZOLAM HCL 2 MG/2ML IJ SOLN
INTRAMUSCULAR | Status: AC
  Filled 2016-03-25: qty 2

## 2016-03-25 MED ORDER — CHLORHEXIDINE GLUCONATE 4 % EX LIQD: 60.0000 mL | Freq: Once | CUTANEOUS | Status: DC

## 2016-03-25 MED ORDER — FERROUS SULFATE 325 (65 FE) MG PO TABS
325.0000 mg | ORAL_TABLET | Freq: Three times a day (TID) | ORAL | Status: DC
  Administered 2016-03-26: 325 mg via ORAL
  Filled 2016-03-25: qty 1

## 2016-03-25 MED ORDER — MEPERIDINE HCL 50 MG/ML IJ SOLN: 6.2500 mg | INTRAMUSCULAR | Status: DC | PRN

## 2016-03-25 MED ORDER — ONDANSETRON HCL 4 MG/2ML IJ SOLN: 4.0000 mg | Freq: Four times a day (QID) | INTRAMUSCULAR | Status: DC | PRN

## 2016-03-25 MED ORDER — SIMVASTATIN 20 MG PO TABS
20.0000 mg | ORAL_TABLET | Freq: Every day | ORAL | Status: DC
  Administered 2016-03-25: 20 mg via ORAL
  Filled 2016-03-25: qty 1

## 2016-03-25 MED ORDER — SODIUM CHLORIDE 0.9 % IR SOLN
Status: DC | PRN
  Administered 2016-03-25: 1000 mL

## 2016-03-25 MED ORDER — SODIUM CHLORIDE 0.9 % IV SOLN
INTRAVENOUS | Status: DC
  Administered 2016-03-26: 02:00:00 via INTRAVENOUS

## 2016-03-25 MED ORDER — HYDROMORPHONE HCL 1 MG/ML IJ SOLN: 0.2500 mg | INTRAMUSCULAR | Status: DC | PRN

## 2016-03-25 MED ORDER — CELECOXIB 200 MG PO CAPS
200.0000 mg | ORAL_CAPSULE | Freq: Two times a day (BID) | ORAL | Status: DC
  Administered 2016-03-25 – 2016-03-26 (×2): 200 mg via ORAL
  Filled 2016-03-25 (×2): qty 1

## 2016-03-25 MED ORDER — BUPIVACAINE-EPINEPHRINE (PF) 0.25% -1:200000 IJ SOLN
INTRAMUSCULAR | Status: AC
  Filled 2016-03-25: qty 30

## 2016-03-25 MED ORDER — FELODIPINE ER 10 MG PO TB24
10.0000 mg | ORAL_TABLET | Freq: Every day | ORAL | Status: DC
  Filled 2016-03-25 (×2): qty 1

## 2016-03-25 MED ORDER — BISACODYL 10 MG RE SUPP: 10.0000 mg | Freq: Every day | RECTAL | Status: DC | PRN

## 2016-03-25 MED ORDER — SODIUM CHLORIDE 0.9 % IJ SOLN
INTRAMUSCULAR | Status: DC | PRN
  Administered 2016-03-25: 30 mL

## 2016-03-25 MED ORDER — 0.9 % SODIUM CHLORIDE (POUR BTL) OPTIME
TOPICAL | Status: DC | PRN
  Administered 2016-03-25: 1000 mL

## 2016-03-25 MED ORDER — MAGNESIUM CITRATE PO SOLN: 1.0000 | Freq: Once | ORAL | Status: DC | PRN

## 2016-03-25 MED ORDER — CEFAZOLIN SODIUM-DEXTROSE 2-4 GM/100ML-% IV SOLN
2.0000 g | INTRAVENOUS | Status: AC
  Administered 2016-03-25 (×2): 2 g via INTRAVENOUS
  Filled 2016-03-25: qty 100

## 2016-03-25 MED ORDER — DOCUSATE SODIUM 100 MG PO CAPS
100.0000 mg | ORAL_CAPSULE | Freq: Two times a day (BID) | ORAL | Status: DC
  Administered 2016-03-25 – 2016-03-26 (×2): 100 mg via ORAL
  Filled 2016-03-25 (×2): qty 1

## 2016-03-25 MED ORDER — DEXAMETHASONE SODIUM PHOSPHATE 10 MG/ML IJ SOLN
10.0000 mg | Freq: Once | INTRAMUSCULAR | Status: AC
  Administered 2016-03-26: 10 mg via INTRAVENOUS
  Filled 2016-03-25: qty 1

## 2016-03-25 MED ORDER — SODIUM CHLORIDE 0.9 % IJ SOLN
INTRAMUSCULAR | Status: AC
  Filled 2016-03-25: qty 50

## 2016-03-25 MED ORDER — PHENYLEPHRINE 40 MCG/ML (10ML) SYRINGE FOR IV PUSH (FOR BLOOD PRESSURE SUPPORT)
PREFILLED_SYRINGE | INTRAVENOUS | Status: AC
  Filled 2016-03-25: qty 20

## 2016-03-25 MED ORDER — PHENYLEPHRINE HCL 10 MG/ML IJ SOLN
INTRAMUSCULAR | Status: DC | PRN
  Administered 2016-03-25 (×2): 80 ug via INTRAVENOUS

## 2016-03-25 MED ORDER — ONDANSETRON HCL 4 MG/2ML IJ SOLN: 4.0000 mg | Freq: Once | INTRAMUSCULAR | Status: DC | PRN

## 2016-03-25 MED ORDER — PROPOFOL 500 MG/50ML IV EMUL
INTRAVENOUS | Status: DC | PRN
  Administered 2016-03-25: 100 ug/kg/min via INTRAVENOUS

## 2016-03-25 MED ORDER — LACTATED RINGERS IV SOLN
INTRAVENOUS | Status: DC | PRN
  Administered 2016-03-25 (×3): via INTRAVENOUS

## 2016-03-25 MED ORDER — METHOCARBAMOL 500 MG PO TABS
500.0000 mg | ORAL_TABLET | Freq: Four times a day (QID) | ORAL | Status: DC | PRN
  Administered 2016-03-25 – 2016-03-26 (×2): 500 mg via ORAL
  Filled 2016-03-25 (×2): qty 1

## 2016-03-25 MED ORDER — METOCLOPRAMIDE HCL 5 MG/ML IJ SOLN: 5.0000 mg | Freq: Three times a day (TID) | INTRAMUSCULAR | Status: DC | PRN

## 2016-03-25 MED ORDER — PROPOFOL 10 MG/ML IV BOLUS
INTRAVENOUS | Status: DC | PRN
  Administered 2016-03-25 (×2): 20 mg via INTRAVENOUS
  Administered 2016-03-25: 30 mg via INTRAVENOUS

## 2016-03-25 MED ORDER — DIPHENHYDRAMINE HCL 25 MG PO CAPS: 25.0000 mg | ORAL_CAPSULE | Freq: Four times a day (QID) | ORAL | Status: DC | PRN

## 2016-03-25 MED ORDER — ONDANSETRON HCL 4 MG PO TABS: 4.0000 mg | ORAL_TABLET | Freq: Four times a day (QID) | ORAL | Status: DC | PRN

## 2016-03-25 MED ORDER — CEFAZOLIN SODIUM-DEXTROSE 2-4 GM/100ML-% IV SOLN
2.0000 g | Freq: Four times a day (QID) | INTRAVENOUS | Status: AC
  Administered 2016-03-25 (×2): 2 g via INTRAVENOUS
  Filled 2016-03-25 (×2): qty 100

## 2016-03-25 SURGICAL SUPPLY — 42 items
BAG ZIPLOCK 12X15 (MISCELLANEOUS) IMPLANT
BANDAGE ACE 6X5 VEL STRL LF (GAUZE/BANDAGES/DRESSINGS) ×3 IMPLANT
BLADE SAW SGTL 13.0X1.19X90.0M (BLADE) ×3 IMPLANT
BONE CEMENT GENTAMICIN (Cement) ×6 IMPLANT
BOWL SMART MIX CTS (DISPOSABLE) ×3 IMPLANT
CAPT KNEE TOTAL 3 ATTUNE ×3 IMPLANT
CEMENT BONE GENTAMICIN 40 (Cement) ×2 IMPLANT
CLOTH BEACON ORANGE TIMEOUT ST (SAFETY) ×3 IMPLANT
CUFF TOURN SGL QUICK 34 (TOURNIQUET CUFF) ×2
CUFF TRNQT CYL 34X4X40X1 (TOURNIQUET CUFF) ×1 IMPLANT
DECANTER SPIKE VIAL GLASS SM (MISCELLANEOUS) ×3 IMPLANT
DRAPE U-SHAPE 47X51 STRL (DRAPES) ×3 IMPLANT
DRESSING AQUACEL AG SP 3.5X10 (GAUZE/BANDAGES/DRESSINGS) ×1 IMPLANT
DRSG AQUACEL AG SP 3.5X10 (GAUZE/BANDAGES/DRESSINGS) ×3
DURAPREP 26ML APPLICATOR (WOUND CARE) ×6 IMPLANT
ELECT REM PT RETURN 9FT ADLT (ELECTROSURGICAL) ×3
ELECTRODE REM PT RTRN 9FT ADLT (ELECTROSURGICAL) ×1 IMPLANT
GLOVE BIOGEL PI IND STRL 7.5 (GLOVE) ×1 IMPLANT
GLOVE BIOGEL PI IND STRL 8.5 (GLOVE) ×1 IMPLANT
GLOVE BIOGEL PI INDICATOR 7.5 (GLOVE) ×2
GLOVE BIOGEL PI INDICATOR 8.5 (GLOVE) ×2
GLOVE ECLIPSE 8.0 STRL XLNG CF (GLOVE) ×6 IMPLANT
GLOVE ORTHO TXT STRL SZ7.5 (GLOVE) ×3 IMPLANT
GOWN STRL REUS W/TWL LRG LVL3 (GOWN DISPOSABLE) ×3 IMPLANT
GOWN STRL REUS W/TWL XL LVL3 (GOWN DISPOSABLE) ×3 IMPLANT
HANDPIECE INTERPULSE COAX TIP (DISPOSABLE) ×2
LIQUID BAND (GAUZE/BANDAGES/DRESSINGS) ×3 IMPLANT
MANIFOLD NEPTUNE II (INSTRUMENTS) ×3 IMPLANT
PACK TOTAL KNEE CUSTOM (KITS) ×3 IMPLANT
POSITIONER SURGICAL ARM (MISCELLANEOUS) ×3 IMPLANT
SET HNDPC FAN SPRY TIP SCT (DISPOSABLE) ×1 IMPLANT
SET PAD KNEE POSITIONER (MISCELLANEOUS) ×3 IMPLANT
SUT MNCRL AB 4-0 PS2 18 (SUTURE) ×3 IMPLANT
SUT VIC AB 1 CT1 36 (SUTURE) ×3 IMPLANT
SUT VIC AB 2-0 CT1 27 (SUTURE) ×6
SUT VIC AB 2-0 CT1 TAPERPNT 27 (SUTURE) ×3 IMPLANT
SUT VLOC 180 0 24IN GS25 (SUTURE) ×3 IMPLANT
SYR 50ML LL SCALE MARK (SYRINGE) ×3 IMPLANT
TRAY FOLEY W/METER SILVER 16FR (SET/KITS/TRAYS/PACK) ×3 IMPLANT
WATER STERILE IRR 1500ML POUR (IV SOLUTION) ×3 IMPLANT
WRAP KNEE MAXI GEL POST OP (GAUZE/BANDAGES/DRESSINGS) ×3 IMPLANT
YANKAUER SUCT BULB TIP 10FT TU (MISCELLANEOUS) ×3 IMPLANT

## 2016-03-25 NOTE — Transfer of Care (Signed)
Immediate Anesthesia Transfer of Care Note  Patient: Jimmy Frazier  Procedure(s) Performed: Procedure(s): LEFT TOTAL KNEE ARTHROPLASTY (Left)  Patient Location: PACU  Anesthesia Type:Spinal  Level of Consciousness:  sedated, patient cooperative and responds to stimulation  Airway & Oxygen Therapy:Patient Spontanous Breathing and Patient connected to face mask oxgen  Post-op Assessment:  Report given to PACU RN and Post -op Vital signs reviewed and stable  Post vital signs:  Reviewed and stable  Last Vitals:  Filed Vitals:   03/25/16 1005  BP: 120/73  Pulse: 93  Temp: 36.6 C  Resp: 18    Complications: No apparent anesthesia complications

## 2016-03-25 NOTE — Anesthesia Procedure Notes (Signed)
Spinal Patient location during procedure: OR Start time: 03/25/2016 2:30 PM End time: 03/25/2016 2:40 PM Staffing Anesthesiologist: Lillia Abed Performed by: anesthesiologist  Preanesthetic Checklist Completed: patient identified, site marked, surgical consent, pre-op evaluation, timeout performed, IV checked, risks and benefits discussed and monitors and equipment checked Spinal Block Patient position: sitting Prep: Betadine Patient monitoring: heart rate, cardiac monitor, continuous pulse ox and blood pressure Approach: right paramedian Location: L3-4 Injection technique: single-shot Needle Needle type: Pencan  Needle gauge: 24 G Needle length: 9 cm Needle insertion depth: 8 cm

## 2016-03-25 NOTE — Interval H&P Note (Signed)
History and Physical Interval Note:  03/25/2016 12:19 PM  Jimmy Frazier  has presented today for surgery, with the diagnosis of LEFT KNEE OA  The various methods of treatment have been discussed with the patient and family. After consideration of risks, benefits and other options for treatment, the patient has consented to  Procedure(s): LEFT TOTAL KNEE ARTHROPLASTY (Left) as a surgical intervention .  The patient's history has been reviewed, patient examined, no change in status, stable for surgery.  I have reviewed the patient's chart and labs.  Questions were answered to the patient's satisfaction.     Mauri Pole

## 2016-03-25 NOTE — Op Note (Signed)
NAME:  Jimmy Frazier                      MEDICAL RECORD NO.:  [id number]                             FACILITY:  [facility]      PHYSICIAN:  Pietro Cassis. Alvan Dame, M.D.  DATE OF BIRTH:  10/24/1951      DATE OF PROCEDURE:  03/25/2016                                     OPERATIVE REPORT         PREOPERATIVE DIAGNOSIS:  Left knee osteoarthritis.      POSTOPERATIVE DIAGNOSIS:  Left knee osteoarthritis.      FINDINGS:  The patient was noted to have complete loss of cartilage and   bone-on-bone arthritis with associated osteophytes in all three compartments of   the knee with a significant synovitis and associated effusion.      PROCEDURE:  Left total knee replacement.      COMPONENTS USED:  DePuy Attune rotating platform posterior stabilized knee   system, a size 6 standard femur, 5 tibia, size 6 mm PS AOX insert, and 38 anatomic patellar   button.      SURGEON:  Pietro Cassis. Alvan Dame, M.D.      ASSISTANT:  Danae Orleans, PA-C.      ANESTHESIA:  Spinal.      SPECIMENS:  None.      COMPLICATION:  None.      DRAINS:  None.  EBL: <100cc      TOURNIQUET TIME:   Total Tourniquet Time Documented: Thigh (Left) - 37 minutes Total: Thigh (Left) - 37 minutes      The patient was stable to the recovery room.      INDICATION FOR PROCEDURE:  Jimmy Frazier is a 65 y.o. male patient of   mine.  The patient had been seen, evaluated, and treated conservatively in the   office with medication, activity modification, and injections.  The patient had   radiographic changes of bone-on-bone arthritis with endplate sclerosis and osteophytes noted.      The patient failed conservative measures including medication, injections, and activity modification, and at this point was ready for more definitive measures.   Based on the radiographic changes and failed conservative measures, the patient   decided to proceed with total knee replacement.  Risks of infection,   DVT, component failure, need for revision  surgery, postop course, and   expectations were all   discussed and reviewed.  Consent was obtained for benefit of pain   relief.      PROCEDURE IN DETAIL:  The patient was brought to the operative theater.   Once adequate anesthesia, preoperative antibiotics, 2 gm of Ancef, 1 gm of Tranexamic Acid, and 10 mg of Decadron administered, the patient was positioned supine with the left thigh tourniquet placed.  The  left lower extremity was prepped and draped in sterile fashion.  A time-   out was performed identifying the patient, planned procedure, and   extremity.      The left lower extremity was placed in the [facility] leg holder.  The leg was   exsanguinated, tourniquet elevated to 250 mmHg.  A midline incision was   made followed by median  parapatellar arthrotomy.  Following initial   exposure, attention was first directed to the patella.  Precut   measurement was noted to be 22 mm.  I resected down to 14 mm and removed peri-patellar osteophytes.  I used a   38 patellar button to restore patellar height as well as cover the cut   surface.      The lug holes were drilled and a metal shim was placed to protect the   patella from retractors and saw blades.      At this point, attention was now directed to the femur.  The femoral   canal was opened with a drill, irrigated to try to prevent fat emboli.  An   intramedullary rod was passed at 5 degrees valgus, 13 mm of bone was   resected off the distal femur due to flexion contracture.  Following this resection, the tibia was   subluxated anteriorly.  Using the extramedullary guide, 2 mm of bone was resected off   the proximal medial tibia.  We confirmed the gap would be   stable medially and laterally with a size 5 mm insert as well as confirmed   the cut was perpendicular in the coronal plane, checking with an alignment rod.      Once this was done, I sized the femur to be a size 6 in the anterior-   posterior dimension, chose a standard  component based on medial and   lateral dimension.  The size 6 rotation block was then pinned in   position anterior referenced using the C-clamp to set rotation.  The   anterior, posterior, and  chamfer cuts were made without difficulty nor   notching making certain that I was along the anterior cortex to help   with flexion gap stability.      The final box cut was made off the lateral aspect of distal femur.      At this point, the tibia was sized to be a size 5, the size 5 tray was   then pinned in position through the medial third of the tubercle,   drilled, and keel punched.  Trial reduction was now carried with a 6 femur,  5 tibia, a size 5 then 6 mm insert, and the 38 anatomic patella botton.  The knee was brought to   extension, full extension with good flexion stability with the patella   tracking through the trochlea without application of pressure.  Given   all these findings the femoral lug holes were drilled and then the trial components removed.  Final components were   opened and cement was mixed.  The knee was irrigated with normal saline   solution and pulse lavage.  The synovial lining was   then injected with 30 cc of 0.25% Marcaine with epinephrine and 1 cc of Toradol plus 30 cc of NS for a    total of 61 cc.      The knee was irrigated.  Final implants were then cemented onto clean and   dried cut surfaces of bone with the knee brought to extension with a size 6 mm trial insert.      Once the cement had fully cured, the excess cement was removed   throughout the knee.  I confirmed I was satisfied with the range of   motion and stability, and the final size 6 mm PS AOX insert was chosen.  It was   placed into the knee.  The tourniquet had been let down at 37 minutes.  No significant   hemostasis required.  The   extensor mechanism was then reapproximated using #1 Vicryl and #0 V-lock sutures with the knee   in flexion.  The   remaining wound was closed with  2-0 Vicryl and running 4-0 Monocryl.   The knee was cleaned, dried, dressed sterilely using Dermabond and   Aquacel dressing.  The patient was then   brought to recovery room in stable condition, tolerating the procedure   well.   Please note that Physician Assistant, Danae Orleans, PA-C, was present for the entirety of the case, and was utilized for pre-operative positioning, peri-operative retractor management, general facilitation of the procedure.  He was also utilized for primary wound closure at the end of the case.              Pietro Cassis Alvan Dame, M.D.    03/25/2016 4:17 PM

## 2016-03-25 NOTE — Anesthesia Preprocedure Evaluation (Signed)
Anesthesia Evaluation  Patient identified by MRN, date of birth, ID band Patient awake    Reviewed: Allergy & Precautions, NPO status , Patient's Chart, lab work & pertinent test results  Airway Mallampati: I  TM Distance: >3 FB Neck ROM: Full    Dental   Pulmonary    Pulmonary exam normal        Cardiovascular hypertension, Pt. on medications Normal cardiovascular exam     Neuro/Psych    GI/Hepatic GERD  Medicated and Controlled,  Endo/Other  diabetes  Renal/GU      Musculoskeletal   Abdominal   Peds  Hematology   Anesthesia Other Findings   Reproductive/Obstetrics                             Anesthesia Physical Anesthesia Plan  ASA: II  Anesthesia Plan: Spinal   Post-op Pain Management:    Induction: Intravenous  Airway Management Planned: Natural Airway  Additional Equipment:   Intra-op Plan:   Post-operative Plan:   Informed Consent: I have reviewed the patients History and Physical, chart, labs and discussed the procedure including the risks, benefits and alternatives for the proposed anesthesia with the patient or authorized representative who has indicated his/her understanding and acceptance.     Plan Discussed with: CRNA and Surgeon  Anesthesia Plan Comments:         Anesthesia Quick Evaluation

## 2016-03-25 NOTE — Anesthesia Postprocedure Evaluation (Signed)
Anesthesia Post Note  Patient: Jimmy Frazier  Procedure(s) Performed: Procedure(s) (LRB): LEFT TOTAL KNEE ARTHROPLASTY (Left)  Patient location during evaluation: PACU Anesthesia Type: Spinal and MAC Level of consciousness: awake and alert Pain management: pain level controlled Vital Signs Assessment: post-procedure vital signs reviewed and stable Respiratory status: spontaneous breathing and respiratory function stable Cardiovascular status: blood pressure returned to baseline and stable Postop Assessment: spinal receding Anesthetic complications: no    Last Vitals:  Filed Vitals:   03/25/16 1757 03/25/16 1851  BP: 122/82 134/92  Pulse: 88 94  Temp: 36.9 C 37 C  Resp: 17 18    Last Pain:  Filed Vitals:   03/25/16 1852  PainSc: 0-No pain                 Lian Tanori DAVID

## 2016-03-26 LAB — BASIC METABOLIC PANEL
Anion gap: 7 (ref 5–15)
BUN: 21 mg/dL — AB (ref 6–20)
CO2: 24 mmol/L (ref 22–32)
Calcium: 8.4 mg/dL — ABNORMAL LOW (ref 8.9–10.3)
Chloride: 104 mmol/L (ref 101–111)
Creatinine, Ser: 1.17 mg/dL (ref 0.61–1.24)
Glucose, Bld: 173 mg/dL — ABNORMAL HIGH (ref 65–99)
POTASSIUM: 4.2 mmol/L (ref 3.5–5.1)
SODIUM: 135 mmol/L (ref 135–145)

## 2016-03-26 LAB — CBC
HCT: 30 % — ABNORMAL LOW (ref 39.0–52.0)
Hemoglobin: 10.3 g/dL — ABNORMAL LOW (ref 13.0–17.0)
MCH: 28.4 pg (ref 26.0–34.0)
MCHC: 34.3 g/dL (ref 30.0–36.0)
MCV: 82.6 fL (ref 78.0–100.0)
PLATELETS: 269 10*3/uL (ref 150–400)
RBC: 3.63 MIL/uL — AB (ref 4.22–5.81)
RDW: 13.5 % (ref 11.5–15.5)
WBC: 12.1 10*3/uL — AB (ref 4.0–10.5)

## 2016-03-26 LAB — GLUCOSE, CAPILLARY
Glucose-Capillary: 129 mg/dL — ABNORMAL HIGH (ref 65–99)
Glucose-Capillary: 194 mg/dL — ABNORMAL HIGH (ref 65–99)

## 2016-03-26 MED ORDER — ASPIRIN 325 MG PO TBEC: 325.0000 mg | DELAYED_RELEASE_TABLET | Freq: Two times a day (BID) | ORAL | Status: AC

## 2016-03-26 MED ORDER — FERROUS SULFATE 325 (65 FE) MG PO TABS: 325.0000 mg | ORAL_TABLET | Freq: Three times a day (TID) | ORAL | Status: DC

## 2016-03-26 MED ORDER — METHOCARBAMOL 500 MG PO TABS: 500.0000 mg | ORAL_TABLET | Freq: Four times a day (QID) | ORAL | Status: DC | PRN

## 2016-03-26 MED ORDER — HYDROCODONE-ACETAMINOPHEN 7.5-325 MG PO TABS: 1.0000 | ORAL_TABLET | ORAL | Status: DC | PRN

## 2016-03-26 MED ORDER — POLYETHYLENE GLYCOL 3350 17 G PO PACK: 17.0000 g | PACK | Freq: Two times a day (BID) | ORAL | Status: DC

## 2016-03-26 MED ORDER — DOCUSATE SODIUM 100 MG PO CAPS: 100.0000 mg | ORAL_CAPSULE | Freq: Two times a day (BID) | ORAL | Status: DC

## 2016-03-26 NOTE — Progress Notes (Signed)
OT Cancellation Note  Patient Details Name: KELDRICK TIFFANY MRN: [id number] DOB: 11-21-1950   Cancelled Treatment:    Reason Eval/Treat Not Completed: PT screened, no needs identified, will sign off.  Pt recently had R TKA.  Eber Ferrufino 03/26/2016, 8:53 AM  Lesle Chris, OTR/L [phone] 03/26/2016

## 2016-03-26 NOTE — Progress Notes (Signed)
     Subjective: 1 Day Post-Op Procedure(s) (LRB): LEFT TOTAL KNEE ARTHROPLASTY (Left)   Patient reports pain as mild, pain controlled. No events throughout the night.  Ready to be discharged home if he does well with PT.   Objective:   VITALS:   Filed Vitals:   03/26/16 0102 03/26/16 0500  BP: 110/71 129/76  Pulse: 95 100  Temp: 98.1 F (36.7 C) 97.8 F (36.6 C)  Resp: 18     Dorsiflexion/Plantar flexion intact Incision: dressing C/D/I No cellulitis present Compartment soft  LABS  Recent Labs  03/26/16 0405  HGB 10.3*  HCT 30.0*  WBC 12.1*  PLT 269     Recent Labs  03/26/16 0405  NA 135  K 4.2  BUN 21*  CREATININE 1.17  GLUCOSE 173*     Assessment/Plan: 1 Day Post-Op Procedure(s) (LRB): LEFT TOTAL KNEE ARTHROPLASTY (Left) Foley cath d/c'ed Advance diet Up with therapy D/C IV fluids Discharge home with home health  Follow up in 2 weeks at [facility]. Follow up with OLIN,Jakai Onofre D in 2 weeks.  Contact information:  [facility] [address] [place] [id number]    Obese (BMI 30-39.9) Estimated body mass index is 32.77 kg/(m^2) as calculated from the following:   Height as of this encounter: 5\' 9"  (1.753 m).   Weight as of this encounter: 100.699 kg (222 lb). Patient also counseled that weight may inhibit the healing process Patient counseled that losing weight will help with future health issues        West Pugh. Quince Santana   PAC  03/26/2016, 9:42 AM

## 2016-03-26 NOTE — Care Management Note (Signed)
Case Management Note  Patient Details  Name: Jimmy Frazier MRN: [id number] Date of Birth: 10-13-1951  Subjective/Objective:                  LEFT TOTAL KNEE ARTHROPLASTY (Left)  Action/Plan: Discharge planning Expected Discharge Date:                  Expected Discharge Plan:  Home/Self Care  In-House Referral:     Discharge planning Services  CM Consult  Post Acute Care Choice:    Choice offered to:  Patient  DME Arranged:  N/A DME Agency:  NA  HH Arranged:  PT [place] Agency:  Other - See comment  Status of Service:  Completed, signed off  Medicare Important Message Given:    Date Medicare IM Given:    Medicare IM give by:    Date Additional Medicare IM Given:    Additional Medicare Important Message give by:     If discussed at [place] of Stay Meetings, dates discussed:    Additional Comments: Cm met with pt to confirm plan is for outpt therapy; pt confirms.  CM retrieved script for pt to take to outpt therapy of his choice (from [place], [place]).  Pt states he has both 3n1 and rolling walker at home.  No other CM needs were communicated. Dellie Catholic, RN 03/26/2016, 2:11 PM

## 2016-03-26 NOTE — Discharge Instructions (Signed)

## 2016-03-26 NOTE — Evaluation (Signed)
Physical Therapy Evaluation Patient Details Name: Jimmy Frazier MRN: [id number] DOB: 15-Aug-1951 Today's Date: 03/26/2016   History of Present Illness  s/p L TKA , has hx of R TKR (4/17) and L total shoulder replacement in 2014.   Clinical Impression  Pt s/p L TKR presents with decreased L LE strength/ROM and post op pain limiting functional mobility.  Pt should progress to dc home with family assist and plans follow up HHPT starting 03/28/16.    Follow Up Recommendations Outpatient PT    Equipment Recommendations  None recommended by PT    Recommendations for Other Services       Precautions / Restrictions Precautions Precautions: Fall;Knee Restrictions Weight Bearing Restrictions: No Other Position/Activity Restrictions: WBAT      Mobility  Bed Mobility Overal bed mobility: Needs Assistance Bed Mobility: Supine to Sit     Supine to sit: Min guard     General bed mobility comments: cues for sequence and use of R LE to self assist  Transfers Overall transfer level: Needs assistance Equipment used: Rolling walker (2 wheeled) Transfers: Sit to/from Stand Sit to Stand: Min assist         General transfer comment: cues for LE management and use of UEs to self assist  Ambulation/Gait Ambulation/Gait assistance: Min assist Ambulation Distance (Feet): 70 Feet Assistive device: Rolling walker (2 wheeled) Gait Pattern/deviations: Step-to pattern;Decreased step length - right;Decreased step length - left;Shuffle;Trunk flexed Gait velocity: decr Gait velocity interpretation: Below normal speed for age/gender General Gait Details: cues for sequence, posture and position from [employer]            Wheelchair Mobility    Modified Rankin (Stroke Patients Only)       Balance                                             Pertinent Vitals/Pain Pain Assessment: 0-10 Pain Score: 6  Pain Location: L knee Pain Descriptors / Indicators:  Aching;Sore Pain Intervention(s): Limited activity within patient's tolerance;Monitored during session;Premedicated before session;Ice applied    Home Living Family/patient expects to be discharged to:: Private residence Living Arrangements: Spouse/significant other Available Help at Discharge: Family;Available 24 hours/day Type of Home: House Home Access: Stairs to enter Entrance Stairs-Rails: None Entrance Stairs-Number of Steps: 1 Home Layout: Two level;Able to live on main level with bedroom/bathroom Home Equipment: Gilford Rile - 2 wheels;Bedside commode      Prior Function Level of Independence: Independent               Hand Dominance        Extremity/Trunk Assessment   Upper Extremity Assessment: Overall WFL for tasks assessed           Lower Extremity Assessment: LLE deficits/detail;RLE deficits/detail RLE Deficits / Details: 4/10 quads with AAROM to 100 flex LLE Deficits / Details: 3/5 quads with AAROM at knee -10 - 60  Cervical / Trunk Assessment: Normal  Communication   Communication: No difficulties  Cognition Arousal/Alertness: Awake/alert Behavior During Therapy: WFL for tasks assessed/performed Overall Cognitive Status: Within Functional Limits for tasks assessed                      General Comments      Exercises Total Joint Exercises Ankle Circles/Pumps: AROM;Both;15 reps;Supine Quad Sets: AROM;Both;10 reps;Supine Heel Slides: AAROM;Both;15 reps;Supine Straight Leg Raises: AAROM;AROM;Right;10 reps;Supine  Assessment/Plan    PT Assessment Patient needs continued PT services  PT Diagnosis Difficulty walking   PT Problem List Decreased strength;Decreased range of motion;Decreased activity tolerance;Decreased mobility;Pain;Decreased knowledge of use of DME  PT Treatment Interventions DME instruction;Gait training;Stair training;Functional mobility training;Therapeutic activities;Therapeutic exercise;Patient/family education   PT  Goals (Current goals can be found in the Care Plan section) Acute Rehab PT Goals Patient Stated Goal: Regain IND PT Goal Formulation: With patient Time For Goal Achievement: 03/28/16 Potential to Achieve Goals: Good    Frequency 7X/week   Barriers to discharge        Co-evaluation               End of Session Equipment Utilized During Treatment: Gait belt Activity Tolerance: Patient tolerated treatment well Patient left: in chair;with call bell/phone within reach;with family/visitor present Nurse Communication: Mobility status         Time: 0820-0858 PT Time Calculation (min) (ACUTE ONLY): 38 min   Charges:   PT Evaluation $PT Eval Low Complexity: 1 Procedure PT Treatments $Gait Training: 8-22 mins $Therapeutic Exercise: 8-22 mins   PT G Codes:        Brek Reece 2016-04-17, 9:34 AM

## 2016-03-26 NOTE — Progress Notes (Signed)
Physical Therapy Treatment Patient Details Name: Jimmy Frazier MRN: [id number] DOB: [birth year] Today's Date: 03/26/2016    History of Present Illness s/p L TKA , has hx of R TKR (4/17) and L total shoulder replacement in 2014.     PT Comments    Pt progressing well and eager for dc home.  Reviewed therex, stairs with pt  Follow Up Recommendations  Outpatient PT     Equipment Recommendations  None recommended by PT    Recommendations for Other Services       Precautions / Restrictions Precautions Precautions: Fall;Knee Restrictions Weight Bearing Restrictions: No Other Position/Activity Restrictions: WBAT    Mobility  Bed Mobility                  Transfers Overall transfer level: Needs assistance Equipment used: Rolling walker (2 wheeled) Transfers: Sit to/from Stand Sit to Stand: Supervision         General transfer comment: cues for LE management and use of UEs to self assist  Ambulation/Gait Ambulation/Gait assistance: Min guard;Supervision Ambulation Distance (Feet): 68 Feet Assistive device: Rolling walker (2 wheeled) Gait Pattern/deviations: Step-to pattern;Decreased step length - right;Decreased step length - left;Shuffle;Trunk flexed Gait velocity: decr Gait velocity interpretation: Below normal speed for age/gender General Gait Details: cues for sequence, posture and position from RW   Stairs Stairs: Yes Stairs assistance: Min assist Stair Management: No rails;Step to pattern;Forwards;With walker Number of Stairs: 2 General stair comments: single step twice   Wheelchair Mobility    Modified Rankin (Stroke Patients Only)       Balance                                    Cognition Arousal/Alertness: Awake/alert Behavior During Therapy: WFL for tasks assessed/performed Overall Cognitive Status: Within Functional Limits for tasks assessed                      Exercises Total Joint Exercises Ankle  Circles/Pumps: AROM;Both;15 reps;Supine Quad Sets: AROM;Both;10 reps;Supine Heel Slides: AAROM;Both;15 reps;Supine Straight Leg Raises: AAROM;AROM;Right;10 reps;Supine Goniometric ROM: AAROM L knee -10 - 80    General Comments        Pertinent Vitals/Pain Pain Assessment: 0-10 Pain Score: 6  Pain Location: L knee Pain Descriptors / Indicators: Aching;Sore Pain Intervention(s): Limited activity within patient's tolerance;Monitored during session;Premedicated before session;Ice applied;Patient requesting pain meds-RN notified    Home Living                      Prior Function            PT Goals (current goals can now be found in the care plan section) Acute Rehab PT Goals Patient Stated Goal: Regain IND PT Goal Formulation: With patient Time For Goal Achievement: 03/28/16 Potential to Achieve Goals: Good Progress towards PT goals: Progressing toward goals    Frequency  7X/week    PT Plan Current plan remains appropriate    Co-evaluation             End of Session Equipment Utilized During Treatment: Gait belt Activity Tolerance: Patient tolerated treatment well Patient left: in chair;with call bell/phone within reach;with family/visitor present     Time: 1337-1410 PT Time Calculation (min) (ACUTE ONLY): 33 min  Charges:  $Gait Training: 8-22 mins $Therapeutic Exercise: 8-22 mins  G Codes:      Carlie Solorzano 04/11/16, 2:12 PM

## 2016-04-01 NOTE — Discharge Summary (Signed)
Physician Discharge Summary  Patient ID: BRAVIN HELMAN MRN: [id number] DOB/AGE: [birth year] 65 y.o.  Admit date: 03/25/2016 Discharge date: 03/26/2016   Procedures:  Procedure(s) (LRB): LEFT TOTAL KNEE ARTHROPLASTY (Left)  Attending Physician:  Dr. Paralee Cancel   Admission Diagnoses:   Left knee primary OA / pain  Discharge Diagnoses:  Principal Problem:   S/P left TKA Active Problems:   Obese  Past Medical History  Diagnosis Date  . Hypertension     takes Prinizide and Felodipine daily  . Hyperlipidemia     but doesn't require meds  . Muscle spasm     Robaxin daily prn  . Back pain     occasionally  . Joint pain   . Joint swelling     knees only  . GERD (gastroesophageal reflux disease)     takes Omeprazole daily prn  . Diverticulosis   . Diabetes mellitus without complication ([place])     borderline;but no meds required yet;diet and exercise  . Headache(784.0)     hx of cluster HA;but none in a while,02-05-16 no longer a problem  . Arthritis     osteoarthritis    HPI:    Jimmy Frazier, 65 y.o. male, has a history of pain and functional disability in the left knee due to arthritis and has failed non-surgical conservative treatments for greater than 12 weeks to include NSAID's and/or analgesics, corticosteriod injections, viscosupplementation injections and activity modification. Onset of symptoms was gradual, starting >10 years ago with gradually worsening course since that time. The patient noted prior procedures on the knee to include arthroplasty on the right knee per Dr. Alvan Dame on 02/12/2016. Patient currently rates pain in the left knee(s) at 9 out of 10 with activity. Patient has worsening of pain with activity and weight bearing, pain that interferes with activities of daily living, pain with passive range of motion, crepitus and joint swelling. Patient has evidence of periarticular osteophytes and joint space narrowing by imaging studies. There is no active  infection. Risks, benefits and expectations were discussed with the patient. Risks including but not limited to the risk of anesthesia, blood clots, nerve damage, blood vessel damage, failure of the prosthesis, infection and up to and including death. Patient understand the risks, benefits and expectations and wishes to proceed with surgery.   PCP: Wonda Cerise, MD   Discharged Condition: good  Hospital Course:  Patient underwent the above stated procedure on 03/25/2016. Patient tolerated the procedure well and brought to the recovery room in good condition and subsequently to the floor.  POD #1 BP: 129/76 ; Pulse: 100 ; Temp: 97.8 F (36.6 C) ; Resp: 18 Patient reports pain as mild, pain controlled. No events throughout the night. Ready to be discharged home. Dorsiflexion/plantar flexion intact, incision: dressing C/D/I, no cellulitis present and compartment soft.   LABS  Basename    HGB     10.3  HCT     30.0    Discharge Exam: General appearance: alert, cooperative and no distress Extremities: Homans sign is negative, no sign of DVT, no edema, redness or tenderness in the calves or thighs and no ulcers, gangrene or trophic changes  Disposition: Home with follow up in 2 weeks   Follow-up Information    Follow up with Mauri Pole, MD. Schedule an appointment as soon as possible for a visit in 2 weeks.   Specialty:  Orthopedic Surgery   Contact information:   [address] [place] [address] [phone]  Discharge Instructions    Call MD / Call 911    Complete by:  As directed   If you experience chest pain or shortness of breath, CALL 911 and be transported to the hospital emergency room.  If you develope a fever above 101 F, pus (white drainage) or increased drainage or redness at the wound, or calf pain, call your surgeon's office.     Change dressing    Complete by:  As directed   Maintain surgical dressing until follow up in the  clinic. If the edges start to pull up, may reinforce with tape. If the dressing is no longer working, may remove and cover with gauze and tape, but must keep the area dry and clean.  Call with any questions or concerns.     Constipation Prevention    Complete by:  As directed   Drink plenty of fluids.  Prune juice may be helpful.  You may use a stool softener, such as Colace (over the counter) 100 mg twice a day.  Use MiraLax (over the counter) for constipation as needed.     Diet - low sodium heart healthy    Complete by:  As directed      Discharge instructions    Complete by:  As directed   Maintain surgical dressing until follow up in the clinic. If the edges start to pull up, may reinforce with tape. If the dressing is no longer working, may remove and cover with gauze and tape, but must keep the area dry and clean.  Follow up in 2 weeks at [facility]. Call with any questions or concerns.     Increase activity slowly as tolerated    Complete by:  As directed   Weight bearing as tolerated with assist device (walker, cane, etc) as directed, use it as long as suggested by your surgeon or therapist, typically at least 4-6 weeks.     TED hose    Complete by:  As directed   Use stockings (TED hose) for 2 weeks on both leg(s).  You may remove them at night for sleeping.             Medication List    STOP taking these medications        aspirin 325 MG tablet  Replaced by:  aspirin 325 MG EC tablet      TAKE these medications        aspirin 325 MG EC tablet  Take 1 tablet (325 mg total) by mouth 2 (two) times daily.     celecoxib 200 MG capsule  Commonly known as:  CELEBREX  Take 1 capsule (200 mg total) by mouth every 12 (twelve) hours.     docusate sodium 100 MG capsule  Commonly known as:  COLACE  Take 1 capsule (100 mg total) by mouth 2 (two) times daily.     felodipine 10 MG 24 hr tablet  Commonly known as:  PLENDIL  Take 10 mg by mouth daily.     ferrous  sulfate 325 (65 FE) MG tablet  Take 1 tablet (325 mg total) by mouth 3 (three) times daily after meals.     HYDROcodone-acetaminophen 7.5-325 MG tablet  Commonly known as:  NORCO  Take 1-2 tablets by mouth every 4 (four) hours as needed for moderate pain.     lisinopril-hydrochlorothiazide 20-12.5 MG tablet  Commonly known as:  PRINZIDE,ZESTORETIC  Take 1 tablet by mouth daily.     methocarbamol 500 MG tablet  Commonly known as:  ROBAXIN  Take 1 tablet (500 mg total) by mouth every 6 (six) hours as needed for muscle spasms.     omeprazole 20 MG capsule  Commonly known as:  PRILOSEC  Take 20 mg by mouth daily as needed (For heartburn or acid reflux.).     polyethylene glycol packet  Commonly known as:  MIRALAX / GLYCOLAX  Take 17 g by mouth 2 (two) times daily.     simvastatin 20 MG tablet  Commonly known as:  ZOCOR  Take 20 mg by mouth every morning.         Signed: [place]. Jamoni Hewes   PA-C  04/01/2016, 4:49 PM

## 2016-04-04 DIAGNOSIS — M25561 Pain in right knee: Secondary | ICD-10-CM | POA: Diagnosis not present

## 2016-04-04 DIAGNOSIS — Z96659 Presence of unspecified artificial knee joint: Secondary | ICD-10-CM | POA: Diagnosis not present

## 2016-04-04 DIAGNOSIS — R2689 Other abnormalities of gait and mobility: Secondary | ICD-10-CM | POA: Diagnosis not present

## 2016-04-04 DIAGNOSIS — Z471 Aftercare following joint replacement surgery: Secondary | ICD-10-CM | POA: Diagnosis not present

## 2016-04-04 DIAGNOSIS — M25361 Other instability, right knee: Secondary | ICD-10-CM | POA: Diagnosis not present

## 2016-04-22 DIAGNOSIS — K219 Gastro-esophageal reflux disease without esophagitis: Secondary | ICD-10-CM | POA: Diagnosis not present

## 2016-04-22 DIAGNOSIS — Z0000 Encounter for general adult medical examination without abnormal findings: Secondary | ICD-10-CM | POA: Diagnosis not present

## 2016-04-22 DIAGNOSIS — E1122 Type 2 diabetes mellitus with diabetic chronic kidney disease: Secondary | ICD-10-CM | POA: Diagnosis not present

## 2016-04-22 DIAGNOSIS — Z125 Encounter for screening for malignant neoplasm of prostate: Secondary | ICD-10-CM | POA: Diagnosis not present

## 2016-04-23 DIAGNOSIS — Z0000 Encounter for general adult medical examination without abnormal findings: Secondary | ICD-10-CM | POA: Diagnosis not present

## 2016-04-23 DIAGNOSIS — N182 Chronic kidney disease, stage 2 (mild): Secondary | ICD-10-CM | POA: Diagnosis not present

## 2016-04-23 DIAGNOSIS — E669 Obesity, unspecified: Secondary | ICD-10-CM | POA: Diagnosis not present

## 2016-04-23 DIAGNOSIS — K219 Gastro-esophageal reflux disease without esophagitis: Secondary | ICD-10-CM | POA: Diagnosis not present

## 2016-04-23 DIAGNOSIS — I129 Hypertensive chronic kidney disease with stage 1 through stage 4 chronic kidney disease, or unspecified chronic kidney disease: Secondary | ICD-10-CM | POA: Diagnosis not present

## 2016-04-23 DIAGNOSIS — E1122 Type 2 diabetes mellitus with diabetic chronic kidney disease: Secondary | ICD-10-CM | POA: Diagnosis not present

## 2016-04-23 DIAGNOSIS — Z125 Encounter for screening for malignant neoplasm of prostate: Secondary | ICD-10-CM | POA: Diagnosis not present

## 2016-04-23 DIAGNOSIS — E785 Hyperlipidemia, unspecified: Secondary | ICD-10-CM | POA: Diagnosis not present

## 2016-04-23 DIAGNOSIS — D649 Anemia, unspecified: Secondary | ICD-10-CM | POA: Diagnosis not present

## 2016-04-23 DIAGNOSIS — Z6832 Body mass index (BMI) 32.0-32.9, adult: Secondary | ICD-10-CM | POA: Diagnosis not present

## 2016-05-05 DIAGNOSIS — M25561 Pain in right knee: Secondary | ICD-10-CM | POA: Diagnosis not present

## 2016-05-05 DIAGNOSIS — R2689 Other abnormalities of gait and mobility: Secondary | ICD-10-CM | POA: Diagnosis not present

## 2016-05-05 DIAGNOSIS — M25661 Stiffness of right knee, not elsewhere classified: Secondary | ICD-10-CM | POA: Diagnosis not present

## 2016-05-05 DIAGNOSIS — Z96652 Presence of left artificial knee joint: Secondary | ICD-10-CM | POA: Diagnosis not present

## 2016-05-05 DIAGNOSIS — Z471 Aftercare following joint replacement surgery: Secondary | ICD-10-CM | POA: Diagnosis not present

## 2016-05-08 DIAGNOSIS — Z471 Aftercare following joint replacement surgery: Secondary | ICD-10-CM | POA: Diagnosis not present

## 2016-05-08 DIAGNOSIS — Z96652 Presence of left artificial knee joint: Secondary | ICD-10-CM | POA: Diagnosis not present

## 2016-07-15 DIAGNOSIS — M62838 Other muscle spasm: Secondary | ICD-10-CM | POA: Diagnosis not present

## 2016-07-15 DIAGNOSIS — Z23 Encounter for immunization: Secondary | ICD-10-CM | POA: Diagnosis not present

## 2016-07-15 DIAGNOSIS — M7061 Trochanteric bursitis, right hip: Secondary | ICD-10-CM | POA: Diagnosis not present

## 2016-07-28 DIAGNOSIS — M7061 Trochanteric bursitis, right hip: Secondary | ICD-10-CM | POA: Diagnosis not present

## 2016-10-22 DIAGNOSIS — I129 Hypertensive chronic kidney disease with stage 1 through stage 4 chronic kidney disease, or unspecified chronic kidney disease: Secondary | ICD-10-CM | POA: Diagnosis not present

## 2016-10-22 DIAGNOSIS — E1122 Type 2 diabetes mellitus with diabetic chronic kidney disease: Secondary | ICD-10-CM | POA: Diagnosis not present

## 2016-10-22 DIAGNOSIS — N182 Chronic kidney disease, stage 2 (mild): Secondary | ICD-10-CM | POA: Diagnosis not present

## 2016-10-22 DIAGNOSIS — E785 Hyperlipidemia, unspecified: Secondary | ICD-10-CM | POA: Diagnosis not present

## 2016-10-23 DIAGNOSIS — I129 Hypertensive chronic kidney disease with stage 1 through stage 4 chronic kidney disease, or unspecified chronic kidney disease: Secondary | ICD-10-CM | POA: Diagnosis not present

## 2016-10-23 DIAGNOSIS — E785 Hyperlipidemia, unspecified: Secondary | ICD-10-CM | POA: Diagnosis not present

## 2016-10-23 DIAGNOSIS — K219 Gastro-esophageal reflux disease without esophagitis: Secondary | ICD-10-CM | POA: Diagnosis not present

## 2016-10-23 DIAGNOSIS — E1122 Type 2 diabetes mellitus with diabetic chronic kidney disease: Secondary | ICD-10-CM | POA: Diagnosis not present

## 2016-10-23 DIAGNOSIS — D649 Anemia, unspecified: Secondary | ICD-10-CM | POA: Diagnosis not present

## 2016-10-23 DIAGNOSIS — N182 Chronic kidney disease, stage 2 (mild): Secondary | ICD-10-CM | POA: Diagnosis not present

## 2016-10-29 DIAGNOSIS — H5213 Myopia, bilateral: Secondary | ICD-10-CM | POA: Diagnosis not present

## 2016-10-29 DIAGNOSIS — H52221 Regular astigmatism, right eye: Secondary | ICD-10-CM | POA: Diagnosis not present

## 2017-04-17 DIAGNOSIS — Z471 Aftercare following joint replacement surgery: Secondary | ICD-10-CM | POA: Diagnosis not present

## 2017-04-17 DIAGNOSIS — Z96653 Presence of artificial knee joint, bilateral: Secondary | ICD-10-CM | POA: Diagnosis not present

## 2017-04-22 DIAGNOSIS — N182 Chronic kidney disease, stage 2 (mild): Secondary | ICD-10-CM | POA: Diagnosis not present

## 2017-04-22 DIAGNOSIS — E1122 Type 2 diabetes mellitus with diabetic chronic kidney disease: Secondary | ICD-10-CM | POA: Diagnosis not present

## 2017-04-22 DIAGNOSIS — Z125 Encounter for screening for malignant neoplasm of prostate: Secondary | ICD-10-CM | POA: Diagnosis not present

## 2017-04-22 DIAGNOSIS — E785 Hyperlipidemia, unspecified: Secondary | ICD-10-CM | POA: Diagnosis not present

## 2017-04-22 DIAGNOSIS — I129 Hypertensive chronic kidney disease with stage 1 through stage 4 chronic kidney disease, or unspecified chronic kidney disease: Secondary | ICD-10-CM | POA: Diagnosis not present

## 2017-04-22 DIAGNOSIS — K219 Gastro-esophageal reflux disease without esophagitis: Secondary | ICD-10-CM | POA: Diagnosis not present

## 2017-04-23 DIAGNOSIS — D649 Anemia, unspecified: Secondary | ICD-10-CM | POA: Diagnosis not present

## 2017-04-23 DIAGNOSIS — I129 Hypertensive chronic kidney disease with stage 1 through stage 4 chronic kidney disease, or unspecified chronic kidney disease: Secondary | ICD-10-CM | POA: Diagnosis not present

## 2017-04-23 DIAGNOSIS — N182 Chronic kidney disease, stage 2 (mild): Secondary | ICD-10-CM | POA: Diagnosis not present

## 2017-04-23 DIAGNOSIS — E785 Hyperlipidemia, unspecified: Secondary | ICD-10-CM | POA: Diagnosis not present

## 2017-04-23 DIAGNOSIS — K219 Gastro-esophageal reflux disease without esophagitis: Secondary | ICD-10-CM | POA: Diagnosis not present

## 2017-04-23 DIAGNOSIS — Z0000 Encounter for general adult medical examination without abnormal findings: Secondary | ICD-10-CM | POA: Diagnosis not present

## 2017-04-23 DIAGNOSIS — F524 Premature ejaculation: Secondary | ICD-10-CM | POA: Diagnosis not present

## 2017-04-23 DIAGNOSIS — E1122 Type 2 diabetes mellitus with diabetic chronic kidney disease: Secondary | ICD-10-CM | POA: Diagnosis not present

## 2017-04-23 DIAGNOSIS — Z23 Encounter for immunization: Secondary | ICD-10-CM | POA: Diagnosis not present

## 2017-11-12 DIAGNOSIS — E1122 Type 2 diabetes mellitus with diabetic chronic kidney disease: Secondary | ICD-10-CM | POA: Diagnosis not present

## 2017-11-12 DIAGNOSIS — E785 Hyperlipidemia, unspecified: Secondary | ICD-10-CM | POA: Diagnosis not present

## 2017-11-12 DIAGNOSIS — Z23 Encounter for immunization: Secondary | ICD-10-CM | POA: Diagnosis not present

## 2017-11-12 DIAGNOSIS — F524 Premature ejaculation: Secondary | ICD-10-CM | POA: Diagnosis not present

## 2017-11-12 DIAGNOSIS — I129 Hypertensive chronic kidney disease with stage 1 through stage 4 chronic kidney disease, or unspecified chronic kidney disease: Secondary | ICD-10-CM | POA: Diagnosis not present

## 2017-11-13 DIAGNOSIS — N182 Chronic kidney disease, stage 2 (mild): Secondary | ICD-10-CM | POA: Diagnosis not present

## 2017-11-13 DIAGNOSIS — E1122 Type 2 diabetes mellitus with diabetic chronic kidney disease: Secondary | ICD-10-CM | POA: Diagnosis not present

## 2017-11-13 DIAGNOSIS — E785 Hyperlipidemia, unspecified: Secondary | ICD-10-CM | POA: Diagnosis not present

## 2017-11-13 DIAGNOSIS — I129 Hypertensive chronic kidney disease with stage 1 through stage 4 chronic kidney disease, or unspecified chronic kidney disease: Secondary | ICD-10-CM | POA: Diagnosis not present

## 2018-01-06 DIAGNOSIS — E1122 Type 2 diabetes mellitus with diabetic chronic kidney disease: Secondary | ICD-10-CM | POA: Diagnosis not present

## 2018-01-06 DIAGNOSIS — N182 Chronic kidney disease, stage 2 (mild): Secondary | ICD-10-CM | POA: Diagnosis not present

## 2018-01-06 DIAGNOSIS — Z125 Encounter for screening for malignant neoplasm of prostate: Secondary | ICD-10-CM | POA: Diagnosis not present

## 2018-01-06 DIAGNOSIS — E785 Hyperlipidemia, unspecified: Secondary | ICD-10-CM | POA: Diagnosis not present

## 2018-01-06 DIAGNOSIS — I129 Hypertensive chronic kidney disease with stage 1 through stage 4 chronic kidney disease, or unspecified chronic kidney disease: Secondary | ICD-10-CM | POA: Diagnosis not present

## 2018-01-07 DIAGNOSIS — N182 Chronic kidney disease, stage 2 (mild): Secondary | ICD-10-CM | POA: Diagnosis not present

## 2018-01-07 DIAGNOSIS — E1122 Type 2 diabetes mellitus with diabetic chronic kidney disease: Secondary | ICD-10-CM | POA: Diagnosis not present

## 2018-01-13 DIAGNOSIS — H5213 Myopia, bilateral: Secondary | ICD-10-CM | POA: Diagnosis not present

## 2018-01-13 DIAGNOSIS — H52221 Regular astigmatism, right eye: Secondary | ICD-10-CM | POA: Diagnosis not present

## 2018-04-27 DIAGNOSIS — Z0000 Encounter for general adult medical examination without abnormal findings: Secondary | ICD-10-CM | POA: Diagnosis not present

## 2018-04-27 DIAGNOSIS — I129 Hypertensive chronic kidney disease with stage 1 through stage 4 chronic kidney disease, or unspecified chronic kidney disease: Secondary | ICD-10-CM | POA: Diagnosis not present

## 2018-04-27 DIAGNOSIS — K219 Gastro-esophageal reflux disease without esophagitis: Secondary | ICD-10-CM | POA: Diagnosis not present

## 2018-04-27 DIAGNOSIS — N182 Chronic kidney disease, stage 2 (mild): Secondary | ICD-10-CM | POA: Diagnosis not present

## 2018-04-27 DIAGNOSIS — E785 Hyperlipidemia, unspecified: Secondary | ICD-10-CM | POA: Diagnosis not present

## 2018-04-27 DIAGNOSIS — E1122 Type 2 diabetes mellitus with diabetic chronic kidney disease: Secondary | ICD-10-CM | POA: Diagnosis not present

## 2018-04-28 DIAGNOSIS — M7061 Trochanteric bursitis, right hip: Secondary | ICD-10-CM | POA: Diagnosis not present

## 2018-07-22 DIAGNOSIS — N453 Epididymo-orchitis: Secondary | ICD-10-CM | POA: Diagnosis not present

## 2018-09-03 DIAGNOSIS — M109 Gout, unspecified: Secondary | ICD-10-CM | POA: Diagnosis not present

## 2018-09-03 DIAGNOSIS — M7751 Other enthesopathy of right foot: Secondary | ICD-10-CM | POA: Diagnosis not present

## 2018-09-13 DIAGNOSIS — M7751 Other enthesopathy of right foot: Secondary | ICD-10-CM | POA: Diagnosis not present

## 2018-10-18 DIAGNOSIS — I129 Hypertensive chronic kidney disease with stage 1 through stage 4 chronic kidney disease, or unspecified chronic kidney disease: Secondary | ICD-10-CM | POA: Diagnosis not present

## 2018-10-18 DIAGNOSIS — N182 Chronic kidney disease, stage 2 (mild): Secondary | ICD-10-CM | POA: Diagnosis not present

## 2018-10-18 DIAGNOSIS — E1122 Type 2 diabetes mellitus with diabetic chronic kidney disease: Secondary | ICD-10-CM | POA: Diagnosis not present

## 2018-10-18 DIAGNOSIS — E785 Hyperlipidemia, unspecified: Secondary | ICD-10-CM | POA: Diagnosis not present

## 2018-10-19 DIAGNOSIS — I129 Hypertensive chronic kidney disease with stage 1 through stage 4 chronic kidney disease, or unspecified chronic kidney disease: Secondary | ICD-10-CM | POA: Diagnosis not present

## 2018-10-19 DIAGNOSIS — D649 Anemia, unspecified: Secondary | ICD-10-CM | POA: Diagnosis not present

## 2018-10-19 DIAGNOSIS — N182 Chronic kidney disease, stage 2 (mild): Secondary | ICD-10-CM | POA: Diagnosis not present

## 2018-10-19 DIAGNOSIS — F524 Premature ejaculation: Secondary | ICD-10-CM | POA: Diagnosis not present

## 2018-10-19 DIAGNOSIS — E669 Obesity, unspecified: Secondary | ICD-10-CM | POA: Diagnosis not present

## 2018-10-19 DIAGNOSIS — E785 Hyperlipidemia, unspecified: Secondary | ICD-10-CM | POA: Diagnosis not present

## 2018-10-19 DIAGNOSIS — E1122 Type 2 diabetes mellitus with diabetic chronic kidney disease: Secondary | ICD-10-CM | POA: Diagnosis not present

## 2018-10-19 DIAGNOSIS — Z6830 Body mass index (BMI) 30.0-30.9, adult: Secondary | ICD-10-CM | POA: Diagnosis not present

## 2019-06-10 DIAGNOSIS — K219 Gastro-esophageal reflux disease without esophagitis: Secondary | ICD-10-CM | POA: Diagnosis not present

## 2019-06-10 DIAGNOSIS — N182 Chronic kidney disease, stage 2 (mild): Secondary | ICD-10-CM | POA: Diagnosis not present

## 2019-06-10 DIAGNOSIS — E1122 Type 2 diabetes mellitus with diabetic chronic kidney disease: Secondary | ICD-10-CM | POA: Diagnosis not present

## 2019-06-10 DIAGNOSIS — E785 Hyperlipidemia, unspecified: Secondary | ICD-10-CM | POA: Diagnosis not present

## 2019-06-13 DIAGNOSIS — M160 Bilateral primary osteoarthritis of hip: Secondary | ICD-10-CM | POA: Diagnosis not present

## 2019-06-13 DIAGNOSIS — Z23 Encounter for immunization: Secondary | ICD-10-CM | POA: Diagnosis not present

## 2019-06-13 DIAGNOSIS — Z0000 Encounter for general adult medical examination without abnormal findings: Secondary | ICD-10-CM | POA: Diagnosis not present

## 2019-06-13 DIAGNOSIS — F524 Premature ejaculation: Secondary | ICD-10-CM | POA: Diagnosis not present

## 2019-06-13 DIAGNOSIS — E785 Hyperlipidemia, unspecified: Secondary | ICD-10-CM | POA: Diagnosis not present

## 2019-06-13 DIAGNOSIS — I129 Hypertensive chronic kidney disease with stage 1 through stage 4 chronic kidney disease, or unspecified chronic kidney disease: Secondary | ICD-10-CM | POA: Diagnosis not present

## 2019-06-13 DIAGNOSIS — K219 Gastro-esophageal reflux disease without esophagitis: Secondary | ICD-10-CM | POA: Diagnosis not present

## 2019-06-13 DIAGNOSIS — M7061 Trochanteric bursitis, right hip: Secondary | ICD-10-CM | POA: Diagnosis not present

## 2019-06-13 DIAGNOSIS — N182 Chronic kidney disease, stage 2 (mild): Secondary | ICD-10-CM | POA: Diagnosis not present

## 2019-06-13 DIAGNOSIS — E1122 Type 2 diabetes mellitus with diabetic chronic kidney disease: Secondary | ICD-10-CM | POA: Diagnosis not present

## 2019-06-27 DIAGNOSIS — M25552 Pain in left hip: Secondary | ICD-10-CM | POA: Diagnosis not present

## 2019-06-27 DIAGNOSIS — Z789 Other specified health status: Secondary | ICD-10-CM | POA: Diagnosis not present

## 2019-06-27 DIAGNOSIS — M25652 Stiffness of left hip, not elsewhere classified: Secondary | ICD-10-CM | POA: Diagnosis not present

## 2019-06-27 DIAGNOSIS — Z7409 Other reduced mobility: Secondary | ICD-10-CM | POA: Diagnosis not present

## 2019-06-27 DIAGNOSIS — G8929 Other chronic pain: Secondary | ICD-10-CM | POA: Diagnosis not present

## 2019-06-27 DIAGNOSIS — M25651 Stiffness of right hip, not elsewhere classified: Secondary | ICD-10-CM | POA: Diagnosis not present

## 2019-06-27 DIAGNOSIS — M25551 Pain in right hip: Secondary | ICD-10-CM | POA: Diagnosis not present

## 2019-08-03 DIAGNOSIS — M25652 Stiffness of left hip, not elsewhere classified: Secondary | ICD-10-CM | POA: Diagnosis not present

## 2019-08-03 DIAGNOSIS — M25552 Pain in left hip: Secondary | ICD-10-CM | POA: Diagnosis not present

## 2019-08-03 DIAGNOSIS — Z7409 Other reduced mobility: Secondary | ICD-10-CM | POA: Diagnosis not present

## 2019-08-03 DIAGNOSIS — G8929 Other chronic pain: Secondary | ICD-10-CM | POA: Diagnosis not present

## 2019-08-03 DIAGNOSIS — M25551 Pain in right hip: Secondary | ICD-10-CM | POA: Diagnosis not present

## 2019-08-03 DIAGNOSIS — M25651 Stiffness of right hip, not elsewhere classified: Secondary | ICD-10-CM | POA: Diagnosis not present

## 2019-08-05 DIAGNOSIS — Z789 Other specified health status: Secondary | ICD-10-CM | POA: Diagnosis not present

## 2019-08-05 DIAGNOSIS — M25551 Pain in right hip: Secondary | ICD-10-CM | POA: Diagnosis not present

## 2019-08-05 DIAGNOSIS — M25552 Pain in left hip: Secondary | ICD-10-CM | POA: Diagnosis not present

## 2019-08-05 DIAGNOSIS — M25651 Stiffness of right hip, not elsewhere classified: Secondary | ICD-10-CM | POA: Diagnosis not present

## 2019-08-05 DIAGNOSIS — G8929 Other chronic pain: Secondary | ICD-10-CM | POA: Diagnosis not present

## 2019-08-05 DIAGNOSIS — Z7409 Other reduced mobility: Secondary | ICD-10-CM | POA: Diagnosis not present

## 2019-08-05 DIAGNOSIS — M25652 Stiffness of left hip, not elsewhere classified: Secondary | ICD-10-CM | POA: Diagnosis not present

## 2019-08-09 DIAGNOSIS — H25813 Combined forms of age-related cataract, bilateral: Secondary | ICD-10-CM | POA: Diagnosis not present

## 2019-08-09 DIAGNOSIS — E119 Type 2 diabetes mellitus without complications: Secondary | ICD-10-CM | POA: Diagnosis not present

## 2019-09-14 DIAGNOSIS — M160 Bilateral primary osteoarthritis of hip: Secondary | ICD-10-CM | POA: Diagnosis not present

## 2019-09-14 DIAGNOSIS — M25552 Pain in left hip: Secondary | ICD-10-CM | POA: Diagnosis not present

## 2019-09-14 DIAGNOSIS — M25551 Pain in right hip: Secondary | ICD-10-CM | POA: Diagnosis not present

## 2019-09-21 DIAGNOSIS — E1122 Type 2 diabetes mellitus with diabetic chronic kidney disease: Secondary | ICD-10-CM | POA: Diagnosis not present

## 2019-09-21 DIAGNOSIS — Z01818 Encounter for other preprocedural examination: Secondary | ICD-10-CM | POA: Diagnosis not present

## 2019-09-21 DIAGNOSIS — N182 Chronic kidney disease, stage 2 (mild): Secondary | ICD-10-CM | POA: Diagnosis not present

## 2019-09-21 DIAGNOSIS — I129 Hypertensive chronic kidney disease with stage 1 through stage 4 chronic kidney disease, or unspecified chronic kidney disease: Secondary | ICD-10-CM | POA: Diagnosis not present

## 2019-09-21 DIAGNOSIS — Z23 Encounter for immunization: Secondary | ICD-10-CM | POA: Diagnosis not present

## 2019-09-21 DIAGNOSIS — I10 Essential (primary) hypertension: Secondary | ICD-10-CM | POA: Diagnosis not present

## 2019-09-21 DIAGNOSIS — E1165 Type 2 diabetes mellitus with hyperglycemia: Secondary | ICD-10-CM | POA: Diagnosis not present

## 2019-09-21 DIAGNOSIS — R200 Anesthesia of skin: Secondary | ICD-10-CM | POA: Diagnosis not present

## 2019-09-28 NOTE — Patient Instructions (Signed)
DUE TO COVID-19 ONLY ONE VISITOR IS ALLOWED TO COME WITH YOU AND STAY IN THE WAITING ROOM ONLY DURING PRE OP AND PROCEDURE DAY OF SURGERY. THE 1 VISITOR MAY VISIT WITH YOU AFTER SURGERY IN YOUR PRIVATE ROOM DURING VISITING HOURS ONLY!  YOU NEED TO HAVE A COVID 19 TEST ON__11/27_____ @_10 :00______, THIS TEST MUST BE DONE BEFORE SURGERY, COME  [address], [place] , [address].  ([place]) ONCE YOUR COVID TEST IS COMPLETED, PLEASE BEGIN THE QUARANTINE INSTRUCTIONS AS OUTLINED IN YOUR HANDOUT.                Jimmy Frazier    Your procedure is scheduled on: 10/04/19   Report to [facility] Main  Entrance   Report to admitting at  10:25 AM     Call this number if you have problems the morning of surgery [place], NO CHEWING GUM [place].   Do not eat food After Midnight.   YOU MAY HAVE CLEAR LIQUIDS FROM MIDNIGHT UNTIL 9:30  AM    CLEAR LIQUID DIET   Foods Allowed                                                                     Foods Excluded  Coffee and tea, regular and decaf                             liquids that you cannot  Plain Jell-O any favor except red or purple                                           see through such as: Fruit ices (not with fruit pulp)                                     milk, soups, orange juice  Iced Popsicles                                    All solid food Carbonated beverages, regular and diet                                    Cranberry, grape and apple juices Sports drinks like Gatorade Lightly seasoned clear broth or consume(fat free) Sugar, honey syrup    . At 9:30 AM Please finish the prescribed Pre-Surgery Gatorade drink.   Nothing by mouth after you finish the Gatorade drink !    Take these medicines the morning of surgery with A SIP OF WATER: Zoloft, Felodipine, prilosec  DO NOT TAKE ANY DIABETIC MEDICATIONS DAY OF YOUR  SURGERY    How to Manage Your Diabetes Before and After Surgery  Why is it important to control my blood sugar before and after surgery? . Improving blood  sugar levels before and after surgery helps healing and can limit problems. . A way of improving blood sugar control is eating a healthy diet by: o  Eating less sugar and carbohydrates o  Increasing activity/exercise o  Talking with your doctor about reaching your blood sugar goals . High blood sugars (greater than 180 mg/dL) can raise your risk of infections and slow your recovery, so you will need to focus on controlling your diabetes during the weeks before surgery. . Make sure that the doctor who takes care of your diabetes knows about your planned surgery including the date and location.  How do I manage my blood sugar before surgery? . Check your blood sugar at least 4 times a day, starting 2 days before surgery, to make sure that the level is not too high or low. o Check your blood sugar the morning of your surgery when you wake up and every 2 hours until you get to the Short Stay unit. . If your blood sugar is less than 70 mg/dL, you will need to treat for low blood sugar: o Do not take insulin. o Treat a low blood sugar (less than 70 mg/dL) with  cup of clear juice (cranberry or apple), 4 glucose tablets, OR glucose gel. o Recheck blood sugar in 15 minutes after treatment (to make sure it is greater than 70 mg/dL). If your blood sugar is not greater than 70 mg/dL on recheck, call [phone] for further instructions. . Report your blood sugar to the short stay nurse when you get to Short Stay.  . If you are admitted to the hospital after surgery: o Your blood sugar will be checked by the staff and you will probably be given insulin after surgery (instead of oral diabetes medicines) to make sure you have good blood sugar levels. o The goal for blood sugar control after surgery is 80-180 mg/dL.   WHAT DO I DO ABOUT MY DIABETES  MEDICATION?  Marland Kitchen Do not take oral diabetes medicines (pills) the morning of surgery.  .                                  You may not have any metal on your body including piercings               Do not wear jewelry, lotions, powders or  deodorant          .              Men may shave face and neck.   Do not bring valuables to the hospital. [place].  Contacts, dentures or bridgework may not be worn into surgery.                    Please read over the following fact sheets you were given: _____________________________________________________________________             [facility] - Preparing for Surgery  Before surgery, you can play an important role.   Because skin is not sterile, your skin needs to be as free of germs as possible .  You can reduce the number of germs on your skin by washing with CHG (chlorahexidine gluconate) soap before surgery.   CHG is an antiseptic cleaner which kills germs and bonds with the skin to continue  killing germs even after washing. Please DO NOT use if you have an allergy to CHG or antibacterial soaps.   If your skin becomes reddened/irritated stop using the CHG and inform your nurse when you arrive at Short Stay.   You may shave your face/neck.  Please follow these instructions carefully:  1.  Shower with CHG Soap the night before surgery and the  morning of Surgery.  2.  If you choose to wash your hair, wash your hair first as usual with your  normal  shampoo.  3.  After you shampoo, rinse your hair and body thoroughly to remove the  shampoo.                                        4.  Use CHG as you would any other liquid soap.  You can apply chg directly  to the skin and wash                       Gently with a scrungie or clean washcloth.  5.  Apply the CHG Soap to your body ONLY FROM THE NECK DOWN.   Do not use on face/ open                           Wound or open sores. Avoid contact  with eyes, ears mouth and genitals (private parts).                       Wash face,  Genitals (private parts) with your normal soap.             6.  Wash thoroughly, paying special attention to the area where your surgery  will be performed.  7.  Thoroughly rinse your body with warm water from the neck down.  8.  DO NOT shower/wash with your normal soap after using and rinsing off  the CHG Soap.             9.  Pat yourself dry with a clean towel.            10.  Wear clean pajamas.            11.  Place clean sheets on your bed the night of your first shower and do not  sleep with pets. Day of Surgery : Do not apply any lotions/deodorants the morning of surgery.  Please wear clean clothes to the hospital/surgery center.  FAILURE TO FOLLOW THESE INSTRUCTIONS MAY RESULT IN THE CANCELLATION OF YOUR SURGERY PATIENT SIGNATURE_________________________________  NURSE SIGNATURE__________________________________  ________________________________________________________________________   [name]  An incentive spirometer is a tool that can help keep your lungs clear and active. This tool measures how well you are filling your lungs with each breath. Taking long deep breaths may help reverse or decrease the chance of developing breathing (pulmonary) problems (especially infection) following:  A long period of time when you are unable to move or be active. BEFORE THE PROCEDURE   If the spirometer includes an indicator to show your best effort, your nurse or respiratory therapist will set it to a desired goal.  If possible, sit up straight or lean slightly forward. Try not to slouch.  Hold the incentive spirometer in an upright position. INSTRUCTIONS FOR USE  1. Sit on the edge of your bed  if possible, or sit up as far as you can in bed or on a chair. 2. Hold the incentive spirometer in an upright position. 3. Breathe out normally. 4. Place the mouthpiece in your mouth and seal  your lips tightly around it. 5. Breathe in slowly and as deeply as possible, raising the piston or the ball toward the top of the column. 6. Hold your breath for 3-5 seconds or for as long as possible. Allow the piston or ball to fall to the bottom of the column. 7. Remove the mouthpiece from your mouth and breathe out normally. 8. Rest for a few seconds and repeat Steps 1 through 7 at least 10 times every 1-2 hours when you are awake. Take your time and take a few normal breaths between deep breaths. 9. The spirometer may include an indicator to show your best effort. Use the indicator as a goal to work toward during each repetition. 10. After each set of 10 deep breaths, practice coughing to be sure your lungs are clear. If you have an incision (the cut made at the time of surgery), support your incision when coughing by placing a pillow or rolled up towels firmly against it. Once you are able to get out of bed, walk around indoors and cough well. You may stop using the incentive spirometer when instructed by your caregiver.  RISKS AND COMPLICATIONS  Take your time so you do not get dizzy or light-headed.  If you are in pain, you may need to take or ask for pain medication before doing incentive spirometry. It is harder to take a deep breath if you are having pain. AFTER USE  Rest and breathe slowly and easily.  It can be helpful to keep track of a log of your progress. Your caregiver can provide you with a simple table to help with this. If you are using the spirometer at home, follow these instructions: [place] IF:   You are having difficultly using the spirometer.  You have trouble using the spirometer as often as instructed.  Your pain medication is not giving enough relief while using the spirometer.  You develop fever of 100.5 F (38.1 C) or higher. SEEK IMMEDIATE MEDICAL CARE IF:   You cough up bloody sputum that had not been present before.  You develop fever of  102 F (38.9 C) or greater.  You develop worsening pain at or near the incision site. MAKE SURE YOU:   Understand these instructions.  Will watch your condition.  Will get help right away if you are not doing well or get worse. Document Released: 03/02/2007 Document Revised: 01/12/2012 Document Reviewed: 05/03/2007 Exit[employer] Patient Information 2014 ExitCare, [place].   ________________________________________________________________________  WHAT IS A BLOOD TRANSFUSION? Blood Transfusion Information  A transfusion is the replacement of blood or some of its parts. Blood is made up of multiple cells which provide different functions.  Red blood cells carry oxygen and are used for blood loss replacement.  White blood cells fight against infection.  Platelets control bleeding.  Plasma helps clot blood.  Other blood products are available for specialized needs, such as hemophilia or other clotting disorders. BEFORE THE TRANSFUSION  Who gives blood for transfusions?   Healthy volunteers who are fully evaluated to make sure their blood is safe. This is blood bank blood. Transfusion therapy is the safest it has ever been in the practice of medicine. Before blood is taken from a donor, a complete history is taken to make  sure that person has no history of diseases nor engages in risky social behavior (examples are intravenous drug use or sexual activity with multiple partners). The donor's travel history is screened to minimize risk of transmitting infections, such as malaria. The donated blood is tested for signs of infectious diseases, such as HIV and hepatitis. The blood is then tested to be sure it is compatible with you in order to minimize the chance of a transfusion reaction. If you or a relative donates blood, this is often done in anticipation of surgery and is not appropriate for emergency situations. It takes many days to process the donated blood. RISKS AND COMPLICATIONS Although  transfusion therapy is very safe and saves many lives, the main dangers of transfusion include:   Getting an infectious disease.  Developing a transfusion reaction. This is an allergic reaction to something in the blood you were given. Every precaution is taken to prevent this. The decision to have a blood transfusion has been considered carefully by your caregiver before blood is given. Blood is not given unless the benefits outweigh the risks. AFTER THE TRANSFUSION  Right after receiving a blood transfusion, you will usually feel much better and more energetic. This is especially true if your red blood cells have gotten low (anemic). The transfusion raises the level of the red blood cells which carry oxygen, and this usually causes an energy increase.  The nurse administering the transfusion will monitor you carefully for complications. HOME CARE INSTRUCTIONS  No special instructions are needed after a transfusion. You may find your energy is better. Speak with your caregiver about any limitations on activity for underlying diseases you may have. SEEK MEDICAL CARE IF:   Your condition is not improving after your transfusion.  You develop redness or irritation at the intravenous (IV) site. SEEK IMMEDIATE MEDICAL CARE IF:  Any of the following symptoms occur over the next 12 hours:  Shaking chills.  You have a temperature by mouth above 102 F (38.9 C), not controlled by medicine.  Chest, back, or muscle pain.  People around you feel you are not acting correctly or are confused.  Shortness of breath or difficulty breathing.  Dizziness and fainting.  You get a rash or develop hives.  You have a decrease in urine output.  Your urine turns a dark color or changes to pink, red, or brown. Any of the following symptoms occur over the next 10 days:  You have a temperature by mouth above 102 F (38.9 C), not controlled by medicine.  Shortness of breath.  Weakness after normal  activity.  The white part of the eye turns yellow (jaundice).  You have a decrease in the amount of urine or are urinating less often.  Your urine turns a dark color or changes to pink, red, or brown. Document Released: 10/17/2000 Document Revised: 01/12/2012 Document Reviewed: 06/05/2008 [facility] Patient Information 2014 [place], [place].  _______________________________________________________________________

## 2019-10-01 ENCOUNTER — Other Ambulatory Visit (HOSPITAL_COMMUNITY)
Admission: RE | Admit: 2019-10-01 | Discharge: 2019-10-01 | Disposition: A | Discharge status: Home / Self Care | Payer: PPO | Source: Ambulatory Visit | Attending: Orthopedic Surgery | Admitting: Orthopedic Surgery

## 2019-10-01 DIAGNOSIS — Z96653 Presence of artificial knee joint, bilateral: Secondary | ICD-10-CM | POA: Diagnosis present

## 2019-10-01 DIAGNOSIS — Z7984 Long term (current) use of oral hypoglycemic drugs: Secondary | ICD-10-CM | POA: Diagnosis not present

## 2019-10-01 DIAGNOSIS — M25751 Osteophyte, right hip: Secondary | ICD-10-CM | POA: Diagnosis not present

## 2019-10-01 DIAGNOSIS — Z6830 Body mass index (BMI) 30.0-30.9, adult: Secondary | ICD-10-CM | POA: Diagnosis not present

## 2019-10-01 DIAGNOSIS — E119 Type 2 diabetes mellitus without complications: Secondary | ICD-10-CM | POA: Diagnosis present

## 2019-10-01 DIAGNOSIS — Z7982 Long term (current) use of aspirin: Secondary | ICD-10-CM | POA: Diagnosis not present

## 2019-10-01 DIAGNOSIS — E785 Hyperlipidemia, unspecified: Secondary | ICD-10-CM | POA: Diagnosis present

## 2019-10-01 DIAGNOSIS — K219 Gastro-esophageal reflux disease without esophagitis: Secondary | ICD-10-CM | POA: Diagnosis present

## 2019-10-01 DIAGNOSIS — I10 Essential (primary) hypertension: Secondary | ICD-10-CM | POA: Diagnosis present

## 2019-10-01 DIAGNOSIS — K5790 Diverticulosis of intestine, part unspecified, without perforation or abscess without bleeding: Secondary | ICD-10-CM | POA: Diagnosis present

## 2019-10-01 DIAGNOSIS — M1611 Unilateral primary osteoarthritis, right hip: Secondary | ICD-10-CM | POA: Diagnosis present

## 2019-10-01 DIAGNOSIS — Z96612 Presence of left artificial shoulder joint: Secondary | ICD-10-CM | POA: Diagnosis present

## 2019-10-01 DIAGNOSIS — Z01812 Encounter for preprocedural laboratory examination: Secondary | ICD-10-CM | POA: Insufficient documentation

## 2019-10-01 DIAGNOSIS — Z471 Aftercare following joint replacement surgery: Secondary | ICD-10-CM | POA: Diagnosis not present

## 2019-10-01 DIAGNOSIS — E669 Obesity, unspecified: Secondary | ICD-10-CM | POA: Diagnosis present

## 2019-10-01 DIAGNOSIS — Z20828 Contact with and (suspected) exposure to other viral communicable diseases: Secondary | ICD-10-CM | POA: Diagnosis present

## 2019-10-01 DIAGNOSIS — Z96641 Presence of right artificial hip joint: Secondary | ICD-10-CM | POA: Diagnosis not present

## 2019-10-01 LAB — SARS CORONAVIRUS 2 (TAT 6-24 HRS): SARS Coronavirus 2: NEGATIVE

## 2019-10-03 ENCOUNTER — Other Ambulatory Visit: Payer: Self-pay

## 2019-10-03 ENCOUNTER — Encounter (HOSPITAL_COMMUNITY): Payer: Self-pay

## 2019-10-03 ENCOUNTER — Encounter (HOSPITAL_COMMUNITY)
Admission: RE | Admit: 2019-10-03 | Discharge: 2019-10-03 | Disposition: A | Discharge status: Home / Self Care | Payer: PPO | Source: Ambulatory Visit | Attending: Orthopedic Surgery | Admitting: Orthopedic Surgery

## 2019-10-03 DIAGNOSIS — Z01810 Encounter for preprocedural cardiovascular examination: Secondary | ICD-10-CM | POA: Insufficient documentation

## 2019-10-03 DIAGNOSIS — Z01812 Encounter for preprocedural laboratory examination: Secondary | ICD-10-CM | POA: Insufficient documentation

## 2019-10-03 DIAGNOSIS — I4510 Unspecified right bundle-branch block: Secondary | ICD-10-CM | POA: Insufficient documentation

## 2019-10-03 DIAGNOSIS — E119 Type 2 diabetes mellitus without complications: Secondary | ICD-10-CM | POA: Insufficient documentation

## 2019-10-03 LAB — BASIC METABOLIC PANEL
Anion gap: 8 (ref 5–15)
BUN: 19 mg/dL (ref 8–23)
CO2: 25 mmol/L (ref 22–32)
Calcium: 9.3 mg/dL (ref 8.9–10.3)
Chloride: 109 mmol/L (ref 98–111)
Creatinine, Ser: 1.39 mg/dL — ABNORMAL HIGH (ref 0.61–1.24)
GFR calc Af Amer: >60 mL/min (ref >60)
GFR calc non Af Amer: 52 mL/min — ABNORMAL LOW (ref >60)
Glucose, Bld: 128 mg/dL — ABNORMAL HIGH (ref 70–99)
Potassium: 4.4 mmol/L (ref 3.5–5.1)
Sodium: 142 mmol/L (ref 135–145)

## 2019-10-03 LAB — SURGICAL PCR SCREEN
MRSA, PCR: POSITIVE — AB
Staphylococcus aureus: POSITIVE — AB

## 2019-10-03 LAB — HEMOGLOBIN A1C
Hgb A1c MFr Bld: 6.5 % — ABNORMAL HIGH (ref 4.8–5.6)
Mean Plasma Glucose: 139.85 mg/dL

## 2019-10-03 LAB — CBC
HCT: 45.6 % (ref 39.0–52.0)
Hemoglobin: 15.4 g/dL (ref 13.0–17.0)
MCH: 29.4 pg (ref 26.0–34.0)
MCHC: 33.8 g/dL (ref 30.0–36.0)
MCV: 87.2 fL (ref 80.0–100.0)
Platelets: 285 10*3/uL (ref 150–400)
RBC: 5.23 MIL/uL (ref 4.22–5.81)
RDW: 14 % (ref 11.5–15.5)
WBC: 5.9 10*3/uL (ref 4.0–10.5)
nRBC: 0 % (ref 0.0–0.2)

## 2019-10-03 LAB — GLUCOSE, CAPILLARY: Glucose-Capillary: 126 mg/dL — ABNORMAL HIGH (ref 70–99)

## 2019-10-03 NOTE — H&P (Signed)
TOTAL HIP ADMISSION H&P  Patient is admitted for right total hip arthroplasty, anterior approach  Subjective:  Chief Complaint: Right hip primary OA / pain  HPI: Jimmy Frazier, 68 y.o. male, has a history of pain and functional disability in the right hip(s) due to arthritis and patient has failed non-surgical conservative treatments for greater than 12 weeks to include NSAID's and/or analgesics and activity modification.  Onset of symptoms was gradual starting  years ago with gradually worsening course since that time.The patient noted no past surgery on the right hip(s).  Patient currently rates pain in the right hip at 7 out of 10 with activity. Patient has night pain, worsening of pain with activity and weight bearing, trendelenberg gait, pain that interfers with activities of daily living and pain with passive range of motion. Patient has evidence of periarticular osteophytes and joint space narrowing by imaging studies. This condition presents safety issues increasing the risk of falls.   There is no current active infection.  Risks, benefits and expectations were discussed with the patient.  Risks including but not limited to the risk of anesthesia, blood clots, nerve damage, blood vessel damage, failure of the prosthesis, infection and up to and including death.  Patient understand the risks, benefits and expectations and wishes to proceed with surgery.   PCP: Jefm Petty, MD  D/C Plans:       Home (poss same day)  Post-op Meds:       No Rx given   Tranexamic Acid:      To be given - IV   Decadron:      Is to be given  FYI:      ASA  Norco  DME:   Rx given for - RW & 3-n-1  PT:  HEP  Ph[facility]: [facility]    Patient Active Problem List   Diagnosis Date Noted  . S/P left TKA 03/25/2016  . Obese 02/13/2016  . S/P right TKA 02/12/2016   Past Medical History:  Diagnosis Date  . Arthritis    osteoarthritis  . Back pain    occasionally  .  Diabetes mellitus without complication ([place])    borderline;but no meds required yet;diet and exercise  . Diverticulosis   . GERD (gastroesophageal reflux disease)    takes Omeprazole daily prn  . Headache(784.0)    hx of cluster HA;but none in a while,02-05-16 no longer a problem  . Hyperlipidemia    but doesn't require meds  . Hypertension    takes Prinizide and Felodipine daily  . Joint pain   . Joint swelling    knees only  . Muscle spasm    Robaxin daily prn    Past Surgical History:  Procedure Laterality Date  . COLONOSCOPY    . ESOPHAGOGASTRODUODENOSCOPY    . KNEE ARTHROSCOPY     2 on right and 1 on left  . right shoulder arthroscopy    . TOTAL KNEE ARTHROPLASTY Right 02/12/2016   Procedure: TOTAL RIGHT KNEE ARTHROPLASTY,INTRARTICULAR INJECTION LEFT KNEE;  Surgeon: Paralee Cancel, MD;  Location: [facility];  Service: Orthopedics;  Laterality: Right;  . TOTAL KNEE ARTHROPLASTY Left 03/25/2016   Procedure: LEFT TOTAL KNEE ARTHROPLASTY;  Surgeon: Paralee Cancel, MD;  Location: [facility];  Service: Orthopedics;  Laterality: Left;  . TOTAL SHOULDER ARTHROPLASTY Left 02/25/2013   Procedure: LEFT TOTAL SHOULDER ARTHROPLASTY ;  Surgeon: Augustin Schooling, MD;  Location: [place];  Service: Orthopedics;  Laterality: Left;  No current facility-administered medications for this encounter.    Current Outpatient Medications  Medication Sig Dispense Refill Last Dose  . aspirin EC 81 MG tablet Take 81 mg by mouth daily.     . diclofenac Sodium (VOLTAREN) 1 % GEL Apply 1 application topically 4 (four) times daily as needed (pain).     . felodipine (PLENDIL) 5 MG 24 hr tablet Take 5 mg by mouth daily.     Marland Kitchen ibuprofen (ADVIL) 200 MG tablet Take 400 mg by mouth every 6 (six) hours as needed for headache or moderate pain.     . metFORMIN (GLUCOPHAGE-XR) 500 MG 24 hr tablet Take 1,000 mg by mouth daily.      Marland Kitchen omeprazole (PRILOSEC) 20 MG capsule Take 20 mg by mouth daily as needed (For heartburn or acid  reflux.).      Marland Kitchen sertraline (ZOLOFT) 50 MG tablet Take 25 mg by mouth daily as needed (premature ejaculation).     . simvastatin (ZOCOR) 20 MG tablet Take 20 mg by mouth every morning.     Marland Kitchen lisinopril-hydrochlorothiazide (ZESTORETIC) 10-12.5 MG tablet Take 1 tablet by mouth daily.      No Known Allergies  Social History   Tobacco Use  . Smoking status: Never Smoker  . Smokeless tobacco: Never Used  Substance Use Topics  . Alcohol use: Yes    Comment: rarely       Review of Systems  Constitutional: Negative.   HENT: Negative.   Eyes: Negative.   Respiratory: Negative.   Cardiovascular: Negative.   Gastrointestinal: Positive for heartburn.  Genitourinary: Negative.   Musculoskeletal: Positive for joint pain.  Skin: Negative.   Neurological: Positive for headaches.  Endo/Heme/Allergies: Negative.   Psychiatric/Behavioral: Negative.     Objective:  Physical Exam  Constitutional: He is oriented to person, place, and time. He appears well-developed.  HENT:  Head: Normocephalic.  Eyes: Pupils are equal, round, and reactive to light.  Neck: Neck supple. No JVD present. No tracheal deviation present. No thyromegaly present.  Cardiovascular: Normal rate, regular rhythm and intact distal pulses.  Respiratory: Effort normal and breath sounds normal. No respiratory distress. He has no wheezes.  GI: Soft. There is no abdominal tenderness. There is no guarding.  Musculoskeletal:     Right hip: He exhibits decreased range of motion, decreased strength, tenderness and bony tenderness. He exhibits no swelling, no deformity and no laceration.  Lymphadenopathy:    He has no cervical adenopathy.  Neurological: He is alert and oriented to person, place, and time.  Skin: Skin is warm and dry.  Psychiatric: He has a normal mood and affect.    Vital signs in last 24 hours: Temp:  [98.2 F (36.8 C)] 98.2 F (36.8 C) (11/30 1024) Pulse Rate:  [82] 82 (11/30 1024) Resp:  [16] 16 (11/30  1024) BP: (129)/(83) 129/83 (11/30 1024) SpO2:  [100 %] 100 % (11/30 1024) Weight:  [96.6 kg] 96.6 kg (11/30 1024)  Labs:   Estimated body mass index is 30.56 kg/m as calculated from the following:   Height as of 10/03/19: 5\' 10"  (1.778 m).   Weight as of 10/03/19: 96.6 kg.   Imaging Review Plain radiographs demonstrate severe degenerative joint disease of the right hip(s). The bone quality appears to be good for age and reported activity level.      Assessment/Plan:  End stage arthritis, right hip(s)  The patient history, physical examination, clinical judgement of the provider and imaging studies are consistent with end stage  degenerative joint disease of the right hip(s) and total hip arthroplasty is deemed medically necessary. The treatment options including medical management, injection therapy, arthroscopy and arthroplasty were discussed at length. The risks and benefits of total hip arthroplasty were presented and reviewed. The risks due to aseptic loosening, infection, stiffness, dislocation/subluxation,  thromboembolic complications and other imponderables were discussed.  The patient acknowledged the explanation, agreed to proceed with the plan and consent was signed. Patient is being admitted for inpatient treatment for surgery, pain control, PT, OT, prophylactic antibiotics, VTE prophylaxis, progressive ambulation and ADL's and discharge planning.The patient is planning to be discharged home.    [place] Tamme Mozingo   PA-C  10/03/2019, 8:14 PM

## 2019-10-03 NOTE — Progress Notes (Signed)
PCP - Dr. Shirley Friar Cardiologist - no  Chest x-ray - no EKG - 10/03/19 Stress Test - no ECHO - no Cardiac Cath - no  Sleep Study - no CPAP -   Fasting Blood Sugar - 120-130 Checks Blood Sugar _QD____ times a day  Blood Thinner Instructions:ASA Aspirin Instructions:staop 5 days before surgery. Last Dose:09/23/19  Anesthesia review:   Patient denies shortness of breath, fever, cough and chest pain at PAT appointment yes  Patient verbalized understanding of instructions that were given to them at the PAT appointment. Patient was also instructed that they will need to review over the PAT instructions again at home before surgery. yes

## 2019-10-04 ENCOUNTER — Inpatient Hospital Stay (HOSPITAL_COMMUNITY)
Admission: RE | Admit: 2019-10-04 | Discharge: 2019-10-05 | DRG: 470 | Disposition: A | Discharge status: Home / Self Care | Payer: PPO | Attending: Orthopedic Surgery | Admitting: Orthopedic Surgery

## 2019-10-04 ENCOUNTER — Encounter (HOSPITAL_COMMUNITY): Payer: Self-pay | Admitting: Anesthesiology

## 2019-10-04 ENCOUNTER — Encounter (HOSPITAL_COMMUNITY)
Admission: RE | Disposition: A | Discharge status: Home / Self Care | Payer: Self-pay | Source: Home / Self Care | Attending: Orthopedic Surgery

## 2019-10-04 ENCOUNTER — Inpatient Hospital Stay (HOSPITAL_COMMUNITY): Payer: PPO | Admitting: Anesthesiology

## 2019-10-04 ENCOUNTER — Inpatient Hospital Stay (HOSPITAL_COMMUNITY): Payer: PPO | Admitting: Physician Assistant

## 2019-10-04 ENCOUNTER — Inpatient Hospital Stay (HOSPITAL_COMMUNITY): Payer: PPO

## 2019-10-04 ENCOUNTER — Other Ambulatory Visit: Payer: Self-pay

## 2019-10-04 DIAGNOSIS — E785 Hyperlipidemia, unspecified: Secondary | ICD-10-CM | POA: Diagnosis present

## 2019-10-04 DIAGNOSIS — Z96612 Presence of left artificial shoulder joint: Secondary | ICD-10-CM | POA: Diagnosis present

## 2019-10-04 DIAGNOSIS — Z6830 Body mass index (BMI) 30.0-30.9, adult: Secondary | ICD-10-CM | POA: Diagnosis not present

## 2019-10-04 DIAGNOSIS — M1611 Unilateral primary osteoarthritis, right hip: Secondary | ICD-10-CM | POA: Diagnosis present

## 2019-10-04 DIAGNOSIS — Z96649 Presence of unspecified artificial hip joint: Secondary | ICD-10-CM

## 2019-10-04 DIAGNOSIS — I10 Essential (primary) hypertension: Secondary | ICD-10-CM | POA: Diagnosis present

## 2019-10-04 DIAGNOSIS — Z20828 Contact with and (suspected) exposure to other viral communicable diseases: Secondary | ICD-10-CM | POA: Diagnosis present

## 2019-10-04 DIAGNOSIS — Z419 Encounter for procedure for purposes other than remedying health state, unspecified: Secondary | ICD-10-CM

## 2019-10-04 DIAGNOSIS — K5790 Diverticulosis of intestine, part unspecified, without perforation or abscess without bleeding: Secondary | ICD-10-CM | POA: Diagnosis present

## 2019-10-04 DIAGNOSIS — Z96653 Presence of artificial knee joint, bilateral: Secondary | ICD-10-CM | POA: Diagnosis present

## 2019-10-04 DIAGNOSIS — Z7984 Long term (current) use of oral hypoglycemic drugs: Secondary | ICD-10-CM

## 2019-10-04 DIAGNOSIS — Z96641 Presence of right artificial hip joint: Secondary | ICD-10-CM | POA: Diagnosis not present

## 2019-10-04 DIAGNOSIS — Z7982 Long term (current) use of aspirin: Secondary | ICD-10-CM

## 2019-10-04 DIAGNOSIS — K219 Gastro-esophageal reflux disease without esophagitis: Secondary | ICD-10-CM | POA: Diagnosis present

## 2019-10-04 DIAGNOSIS — E669 Obesity, unspecified: Secondary | ICD-10-CM | POA: Diagnosis present

## 2019-10-04 DIAGNOSIS — E119 Type 2 diabetes mellitus without complications: Secondary | ICD-10-CM | POA: Diagnosis present

## 2019-10-04 DIAGNOSIS — Z471 Aftercare following joint replacement surgery: Secondary | ICD-10-CM | POA: Diagnosis not present

## 2019-10-04 HISTORY — PX: TOTAL HIP ARTHROPLASTY: SHX124

## 2019-10-04 LAB — TYPE AND SCREEN
ABO/RH(D): O POS
Antibody Screen: NEGATIVE

## 2019-10-04 LAB — GLUCOSE, CAPILLARY: Glucose-Capillary: 124 mg/dL — ABNORMAL HIGH (ref 70–99)

## 2019-10-04 SURGERY — ARTHROPLASTY, HIP, TOTAL, ANTERIOR APPROACH
Anesthesia: Spinal | Site: Hip | Laterality: Right

## 2019-10-04 MED ORDER — HYDROCODONE-ACETAMINOPHEN 7.5-325 MG PO TABS
1.0000 | ORAL_TABLET | ORAL | Status: DC | PRN
  Administered 2019-10-04: 2 via ORAL
  Filled 2019-10-04: qty 2

## 2019-10-04 MED ORDER — DEXAMETHASONE SODIUM PHOSPHATE 10 MG/ML IJ SOLN
10.0000 mg | Freq: Once | INTRAMUSCULAR | Status: AC
  Administered 2019-10-05: 10 mg via INTRAVENOUS
  Filled 2019-10-04: qty 1

## 2019-10-04 MED ORDER — PROPOFOL 10 MG/ML IV BOLUS
INTRAVENOUS | Status: AC
  Filled 2019-10-04: qty 20

## 2019-10-04 MED ORDER — METHOCARBAMOL 500 MG PO TABS: 500.0000 mg | ORAL_TABLET | Freq: Four times a day (QID) | ORAL | 0 refills | Status: DC | PRN

## 2019-10-04 MED ORDER — BISACODYL 10 MG RE SUPP: 10.0000 mg | Freq: Every day | RECTAL | Status: DC | PRN

## 2019-10-04 MED ORDER — FERROUS SULFATE 325 (65 FE) MG PO TABS: 325.0000 mg | ORAL_TABLET | Freq: Three times a day (TID) | ORAL | 0 refills | Status: DC

## 2019-10-04 MED ORDER — LACTATED RINGERS IV SOLN
INTRAVENOUS | Status: DC
  Administered 2019-10-04 (×3): via INTRAVENOUS

## 2019-10-04 MED ORDER — CELECOXIB 200 MG PO CAPS
200.0000 mg | ORAL_CAPSULE | Freq: Two times a day (BID) | ORAL | Status: DC
  Administered 2019-10-04: 200 mg via ORAL
  Filled 2019-10-04: qty 1

## 2019-10-04 MED ORDER — POLYETHYLENE GLYCOL 3350 17 G PO PACK
17.0000 g | PACK | Freq: Two times a day (BID) | ORAL | Status: DC
  Administered 2019-10-04: 22:00:00 17 g via ORAL
  Filled 2019-10-04 (×2): qty 1

## 2019-10-04 MED ORDER — ASPIRIN 81 MG PO CHEW
81.0000 mg | CHEWABLE_TABLET | Freq: Two times a day (BID) | ORAL | Status: DC
  Administered 2019-10-04 – 2019-10-05 (×2): 81 mg via ORAL
  Filled 2019-10-04 (×2): qty 1

## 2019-10-04 MED ORDER — SODIUM CHLORIDE 0.9 % IR SOLN
Status: DC | PRN
  Administered 2019-10-04: 1000 mL

## 2019-10-04 MED ORDER — DOCUSATE SODIUM 100 MG PO CAPS: 100.0000 mg | ORAL_CAPSULE | Freq: Two times a day (BID) | ORAL | 0 refills | Status: AC

## 2019-10-04 MED ORDER — SODIUM CHLORIDE 0.9 % IV BOLUS: 250.0000 mL | Freq: Once | INTRAVENOUS | Status: DC

## 2019-10-04 MED ORDER — MIDAZOLAM HCL 5 MG/5ML IJ SOLN
INTRAMUSCULAR | Status: DC | PRN
  Administered 2019-10-04: 2 mg via INTRAVENOUS

## 2019-10-04 MED ORDER — METHOCARBAMOL 500 MG PO TABS
500.0000 mg | ORAL_TABLET | Freq: Four times a day (QID) | ORAL | Status: DC | PRN
  Administered 2019-10-04: 500 mg via ORAL
  Filled 2019-10-04: qty 1

## 2019-10-04 MED ORDER — PHENOL 1.4 % MT LIQD: 1.0000 | OROMUCOSAL | Status: DC | PRN

## 2019-10-04 MED ORDER — PANTOPRAZOLE SODIUM 40 MG PO TBEC: 40.0000 mg | DELAYED_RELEASE_TABLET | Freq: Every day | ORAL | Status: DC | PRN

## 2019-10-04 MED ORDER — ASPIRIN 81 MG PO CHEW: 81.0000 mg | CHEWABLE_TABLET | Freq: Two times a day (BID) | ORAL | 0 refills | Status: AC

## 2019-10-04 MED ORDER — DIPHENHYDRAMINE HCL 12.5 MG/5ML PO ELIX: 12.5000 mg | ORAL_SOLUTION | ORAL | Status: DC | PRN

## 2019-10-04 MED ORDER — MIDAZOLAM HCL 2 MG/2ML IJ SOLN: 0.5000 mg | Freq: Once | INTRAMUSCULAR | Status: DC | PRN

## 2019-10-04 MED ORDER — ACETAMINOPHEN 325 MG PO TABS: 325.0000 mg | ORAL_TABLET | Freq: Four times a day (QID) | ORAL | Status: DC | PRN

## 2019-10-04 MED ORDER — METOCLOPRAMIDE HCL 5 MG/ML IJ SOLN: 5.0000 mg | Freq: Three times a day (TID) | INTRAMUSCULAR | Status: DC | PRN

## 2019-10-04 MED ORDER — POLYETHYLENE GLYCOL 3350 17 G PO PACK: 17.0000 g | PACK | Freq: Two times a day (BID) | ORAL | 0 refills | Status: AC

## 2019-10-04 MED ORDER — FENTANYL CITRATE (PF) 100 MCG/2ML IJ SOLN
INTRAMUSCULAR | Status: AC
  Filled 2019-10-04: qty 2

## 2019-10-04 MED ORDER — FENTANYL CITRATE (PF) 100 MCG/2ML IJ SOLN
INTRAMUSCULAR | Status: DC | PRN
  Administered 2019-10-04: 25 ug via INTRAVENOUS
  Administered 2019-10-04: 75 ug via INTRAVENOUS

## 2019-10-04 MED ORDER — MENTHOL 3 MG MT LOZG: 1.0000 | LOZENGE | OROMUCOSAL | Status: DC | PRN

## 2019-10-04 MED ORDER — CHLORHEXIDINE GLUCONATE 4 % EX LIQD
60.0000 mL | Freq: Once | CUTANEOUS | Status: AC
  Administered 2019-10-04: 4 via TOPICAL

## 2019-10-04 MED ORDER — TRANEXAMIC ACID-NACL 1000-0.7 MG/100ML-% IV SOLN
1000.0000 mg | Freq: Once | INTRAVENOUS | Status: AC
  Administered 2019-10-04: 1000 mg via INTRAVENOUS
  Filled 2019-10-04: qty 100

## 2019-10-04 MED ORDER — CEFAZOLIN SODIUM-DEXTROSE 2-4 GM/100ML-% IV SOLN
2.0000 g | INTRAVENOUS | Status: AC
  Administered 2019-10-04: 12:00:00 2 g via INTRAVENOUS
  Filled 2019-10-04: qty 100

## 2019-10-04 MED ORDER — MEPERIDINE HCL 50 MG/ML IJ SOLN: 6.2500 mg | INTRAMUSCULAR | Status: DC | PRN

## 2019-10-04 MED ORDER — ALUM & MAG HYDROXIDE-SIMETH 200-200-20 MG/5ML PO SUSP: 15.0000 mL | ORAL | Status: DC | PRN

## 2019-10-04 MED ORDER — SODIUM CHLORIDE 0.9 % IV SOLN
INTRAVENOUS | Status: DC
  Administered 2019-10-04: 17:00:00 via INTRAVENOUS

## 2019-10-04 MED ORDER — MAGNESIUM CITRATE PO SOLN: 1.0000 | Freq: Once | ORAL | Status: DC | PRN

## 2019-10-04 MED ORDER — DEXAMETHASONE SODIUM PHOSPHATE 10 MG/ML IJ SOLN
10.0000 mg | Freq: Once | INTRAMUSCULAR | Status: AC
  Administered 2019-10-04: 4 mg via INTRAVENOUS

## 2019-10-04 MED ORDER — HYDROCODONE-ACETAMINOPHEN 5-325 MG PO TABS
1.0000 | ORAL_TABLET | ORAL | Status: DC | PRN
  Administered 2019-10-04 – 2019-10-05 (×3): 2 via ORAL
  Filled 2019-10-04 (×3): qty 2

## 2019-10-04 MED ORDER — TRANEXAMIC ACID-NACL 1000-0.7 MG/100ML-% IV SOLN
1000.0000 mg | INTRAVENOUS | Status: AC
  Administered 2019-10-04: 13:00:00 1000 mg via INTRAVENOUS
  Filled 2019-10-04: qty 100

## 2019-10-04 MED ORDER — METFORMIN HCL ER 500 MG PO TB24
1000.0000 mg | ORAL_TABLET | Freq: Every day | ORAL | Status: DC
  Administered 2019-10-05: 1000 mg via ORAL
  Filled 2019-10-04: qty 2

## 2019-10-04 MED ORDER — FERROUS SULFATE 325 (65 FE) MG PO TABS
325.0000 mg | ORAL_TABLET | Freq: Three times a day (TID) | ORAL | Status: DC
  Administered 2019-10-05: 325 mg via ORAL
  Filled 2019-10-04: qty 1

## 2019-10-04 MED ORDER — PHENYLEPHRINE HCL-NACL 10-0.9 MG/250ML-% IV SOLN
INTRAVENOUS | Status: DC | PRN
  Administered 2019-10-04: 25 ug/min via INTRAVENOUS

## 2019-10-04 MED ORDER — MIDAZOLAM HCL 2 MG/2ML IJ SOLN
INTRAMUSCULAR | Status: AC
  Filled 2019-10-04: qty 2

## 2019-10-04 MED ORDER — EPHEDRINE 5 MG/ML INJ
INTRAVENOUS | Status: AC
  Filled 2019-10-04: qty 10

## 2019-10-04 MED ORDER — BUPIVACAINE IN DEXTROSE 0.75-8.25 % IT SOLN
INTRATHECAL | Status: DC | PRN
  Administered 2019-10-04: 2 mL via INTRATHECAL

## 2019-10-04 MED ORDER — HYDROCODONE-ACETAMINOPHEN 7.5-325 MG PO TABS: 1.0000 | ORAL_TABLET | ORAL | 0 refills | Status: DC | PRN

## 2019-10-04 MED ORDER — HYDROMORPHONE HCL 1 MG/ML IJ SOLN: 0.5000 mg | INTRAMUSCULAR | Status: DC | PRN

## 2019-10-04 MED ORDER — ONDANSETRON HCL 4 MG/2ML IJ SOLN: 4.0000 mg | Freq: Four times a day (QID) | INTRAMUSCULAR | Status: DC | PRN

## 2019-10-04 MED ORDER — PROPOFOL 500 MG/50ML IV EMUL
INTRAVENOUS | Status: DC | PRN
  Administered 2019-10-04: 75 ug/kg/min via INTRAVENOUS

## 2019-10-04 MED ORDER — PROMETHAZINE HCL 25 MG/ML IJ SOLN: 6.2500 mg | INTRAMUSCULAR | Status: DC | PRN

## 2019-10-04 MED ORDER — METHOCARBAMOL 500 MG IVPB - SIMPLE MED
500.0000 mg | Freq: Four times a day (QID) | INTRAVENOUS | Status: DC | PRN
  Administered 2019-10-04: 500 mg via INTRAVENOUS
  Filled 2019-10-04: qty 50

## 2019-10-04 MED ORDER — LIDOCAINE HCL (CARDIAC) PF 100 MG/5ML IV SOSY
PREFILLED_SYRINGE | INTRAVENOUS | Status: DC | PRN
  Administered 2019-10-04: 60 mg via INTRATRACHEAL

## 2019-10-04 MED ORDER — CEFAZOLIN SODIUM-DEXTROSE 2-4 GM/100ML-% IV SOLN
2.0000 g | Freq: Four times a day (QID) | INTRAVENOUS | Status: AC
  Administered 2019-10-04 (×2): 2 g via INTRAVENOUS
  Filled 2019-10-04 (×2): qty 100

## 2019-10-04 MED ORDER — VANCOMYCIN HCL IN DEXTROSE 1-5 GM/200ML-% IV SOLN
1000.0000 mg | Freq: Once | INTRAVENOUS | Status: AC
  Administered 2019-10-04: 1000 mg via INTRAVENOUS
  Filled 2019-10-04: qty 200

## 2019-10-04 MED ORDER — METHOCARBAMOL 500 MG IVPB - SIMPLE MED
INTRAVENOUS | Status: AC
  Filled 2019-10-04: qty 50

## 2019-10-04 MED ORDER — SIMVASTATIN 20 MG PO TABS
20.0000 mg | ORAL_TABLET | Freq: Every morning | ORAL | Status: DC
  Administered 2019-10-05: 20 mg via ORAL
  Filled 2019-10-04: qty 1

## 2019-10-04 MED ORDER — DOCUSATE SODIUM 100 MG PO CAPS
100.0000 mg | ORAL_CAPSULE | Freq: Two times a day (BID) | ORAL | Status: DC
  Administered 2019-10-04 – 2019-10-05 (×2): 100 mg via ORAL
  Filled 2019-10-04 (×2): qty 1

## 2019-10-04 MED ORDER — PROPOFOL 10 MG/ML IV BOLUS
INTRAVENOUS | Status: DC | PRN
  Administered 2019-10-04: 30 mg via INTRAVENOUS

## 2019-10-04 MED ORDER — METOCLOPRAMIDE HCL 5 MG PO TABS: 5.0000 mg | ORAL_TABLET | Freq: Three times a day (TID) | ORAL | Status: DC | PRN

## 2019-10-04 MED ORDER — SERTRALINE HCL 25 MG PO TABS: 25.0000 mg | ORAL_TABLET | Freq: Every day | ORAL | Status: DC | PRN

## 2019-10-04 MED ORDER — ONDANSETRON HCL 4 MG PO TABS
4.0000 mg | ORAL_TABLET | Freq: Four times a day (QID) | ORAL | Status: DC | PRN
  Administered 2019-10-05: 4 mg via ORAL
  Filled 2019-10-04: qty 1

## 2019-10-04 MED ORDER — HYDROMORPHONE HCL 1 MG/ML IJ SOLN: 0.2500 mg | INTRAMUSCULAR | Status: DC | PRN

## 2019-10-04 MED ORDER — ONDANSETRON HCL 4 MG/2ML IJ SOLN
INTRAMUSCULAR | Status: DC | PRN
  Administered 2019-10-04: 4 mg via INTRAVENOUS

## 2019-10-04 MED ORDER — FELODIPINE ER 5 MG PO TB24
5.0000 mg | ORAL_TABLET | Freq: Every day | ORAL | Status: DC
  Administered 2019-10-04: 5 mg via ORAL
  Filled 2019-10-04 (×2): qty 1

## 2019-10-04 SURGICAL SUPPLY — 47 items
BAG DECANTER FOR FLEXI CONT (MISCELLANEOUS) IMPLANT
BAG ZIPLOCK 12X15 (MISCELLANEOUS) IMPLANT
BLADE SAG 18X100X1.27 (BLADE) ×3 IMPLANT
BLADE SURG SZ10 CARB STEEL (BLADE) ×6 IMPLANT
COVER PERINEAL POST (MISCELLANEOUS) ×3 IMPLANT
COVER SURGICAL LIGHT HANDLE (MISCELLANEOUS) ×3 IMPLANT
COVER WAND RF STERILE (DRAPES) IMPLANT
CUP ACET PINNACLE SECTR 56MM (Hips) ×1 IMPLANT
DERMABOND ADVANCED (GAUZE/BANDAGES/DRESSINGS) ×2
DERMABOND ADVANCED .7 DNX12 (GAUZE/BANDAGES/DRESSINGS) ×1 IMPLANT
DRAPE STERI IOBAN 125X83 (DRAPES) ×3 IMPLANT
DRAPE U-SHAPE 47X51 STRL (DRAPES) ×6 IMPLANT
DRESSING AQUACEL AG SP 3.5X10 (GAUZE/BANDAGES/DRESSINGS) ×1 IMPLANT
DRSG AQUACEL AG SP 3.5X10 (GAUZE/BANDAGES/DRESSINGS) ×3
DURAPREP 26ML APPLICATOR (WOUND CARE) ×3 IMPLANT
ELECT BLADE TIP CTD 4 INCH (ELECTRODE) ×3 IMPLANT
ELECT REM PT RETURN 15FT ADLT (MISCELLANEOUS) ×3 IMPLANT
ELIMINATOR HOLE APEX DEPUY (Hips) ×3 IMPLANT
GLOVE BIO SURGEON STRL SZ 6 (GLOVE) ×6 IMPLANT
GLOVE BIOGEL PI IND STRL 6.5 (GLOVE) ×1 IMPLANT
GLOVE BIOGEL PI IND STRL 7.5 (GLOVE) ×1 IMPLANT
GLOVE BIOGEL PI IND STRL 8.5 (GLOVE) ×1 IMPLANT
GLOVE BIOGEL PI INDICATOR 6.5 (GLOVE) ×2
GLOVE BIOGEL PI INDICATOR 7.5 (GLOVE) ×2
GLOVE BIOGEL PI INDICATOR 8.5 (GLOVE) ×2
GLOVE ECLIPSE 8.0 STRL XLNG CF (GLOVE) ×6 IMPLANT
GLOVE ORTHO TXT STRL SZ7.5 (GLOVE) ×6 IMPLANT
GOWN STRL REUS W/TWL LRG LVL3 (GOWN DISPOSABLE) ×6 IMPLANT
GOWN STRL REUS W/TWL XL LVL3 (GOWN DISPOSABLE) ×3 IMPLANT
HEAD CERAMIC DELTA 36 PLUS 1.5 (Hips) ×3 IMPLANT
HOLDER FOLEY CATH W/STRAP (MISCELLANEOUS) ×3 IMPLANT
KIT TURNOVER KIT A (KITS) IMPLANT
PACK ANTERIOR HIP CUSTOM (KITS) ×3 IMPLANT
PENCIL SMOKE EVACUATOR (MISCELLANEOUS) IMPLANT
PINNACLE ALTRX PLUS 4 N 36X56 (Hips) ×3 IMPLANT
PINNACLE SECTOR CUP 56MM (Hips) ×3 IMPLANT
SCREW 6.5MMX25MM (Screw) ×3 IMPLANT
STEM FEMORAL SZ6 HIGH ACTIS (Stem) ×3 IMPLANT
SUT MNCRL AB 4-0 PS2 18 (SUTURE) ×3 IMPLANT
SUT STRATAFIX 0 PDS 27 VIOLET (SUTURE) ×3
SUT VIC AB 1 CT1 36 (SUTURE) ×9 IMPLANT
SUT VIC AB 2-0 CT1 27 (SUTURE) ×4
SUT VIC AB 2-0 CT1 TAPERPNT 27 (SUTURE) ×2 IMPLANT
SUTURE STRATFX 0 PDS 27 VIOLET (SUTURE) ×1 IMPLANT
TRAY FOLEY MTR SLVR 16FR STAT (SET/KITS/TRAYS/PACK) IMPLANT
WATER STERILE IRR 1000ML POUR (IV SOLUTION) ×3 IMPLANT
YANKAUER SUCT BULB TIP 10FT TU (MISCELLANEOUS) IMPLANT

## 2019-10-04 NOTE — Anesthesia Procedure Notes (Signed)
Spinal  Patient location during procedure: OR End time: 10/04/2019 12:38 PM Staffing Anesthesiologist: Annye Asa, MD Performed: anesthesiologist  Preanesthetic Checklist Completed: patient identified, site marked, surgical consent, pre-op evaluation, timeout performed, IV checked, risks and benefits discussed and monitors and equipment checked Spinal Block Patient position: sitting Prep: site prepped and draped and DuraPrep Patient monitoring: blood pressure, continuous pulse ox, cardiac monitor and heart rate Approach: midline Location: L3-4 Injection technique: single-shot Needle Needle type: Pencan  Needle gauge: 24 G Needle length: 9 cm Additional Notes Pt identified in Operating room.  Monitors applied. Working IV access confirmed. Sterile prep, drape lumbar spine.  1% lido local L 3,4.  severall attempts CRNA, then MD and #24ga Pencan into clear CSF L 3,4.  15mg  0.75% Bupivacaine with dextrose injected with asp CSF beginning and end of injection.  Patient asymptomatic, VSS, no heme aspirated, tolerated well.  Jenita Seashore, MD

## 2019-10-04 NOTE — Interval H&P Note (Signed)
History and Physical Interval Note:  10/04/2019 11:18 AM  Jimmy Frazier  has presented today for surgery, with the diagnosis of Right hip osteoarthritis.  The various methods of treatment have been discussed with the patient and family. After consideration of risks, benefits and other options for treatment, the patient has consented to  Procedure(s) with comments: [place] (Right) - 51mins as a surgical intervention.  The patient's history has been reviewed, patient examined, no change in status, stable for surgery.  I have reviewed the patient's chart and labs.  Questions were answered to the patient's satisfaction.     Mauri Pole

## 2019-10-04 NOTE — Discharge Instructions (Signed)

## 2019-10-04 NOTE — Evaluation (Signed)
Physical Therapy Evaluation Patient Details Name: Jimmy Frazier MRN: [id number] DOB: Aug 08, 1951 Today's Date: 10/04/2019   History of Present Illness  R DA-THA; PMH B TKA, L TSA, DM  Clinical Impression  Pt is s/p THA resulting in the deficits listed below (see PT Problem List). Pt ambulated 25' with RW, distance limited by nausea. Initiated THA HEP. Good progress expected.  Pt will benefit from skilled PT to increase their independence and safety with mobility to allow discharge to the venue listed below.      Follow Up Recommendations Follow surgeon's recommendation for DC plan and follow-up therapies    Equipment Recommendations  None recommended by PT    Recommendations for Other Services       Precautions / Restrictions Precautions Precautions: Fall Restrictions Weight Bearing Restrictions: No      Mobility  Bed Mobility Overal bed mobility: Needs Assistance Bed Mobility: Supine to Sit     Supine to sit: Min assist     General bed mobility comments: used rails, HOB up, min A to raise trunk  Transfers Overall transfer level: Needs assistance Equipment used: Rolling walker (2 wheeled) Transfers: Sit to/from Stand Sit to Stand: Min guard;From elevated surface         General transfer comment: VCs hand placement  Ambulation/Gait Ambulation/Gait assistance: Min guard Gait Distance (Feet): 25 Feet Assistive device: Rolling walker (2 wheeled) Gait Pattern/deviations: Step-through pattern;Decreased stride length Gait velocity: decr   General Gait Details: VCs sequencing, no loss of balance, distance limited by nausea  Stairs            Wheelchair Mobility    Modified Rankin (Stroke Patients Only)       Balance Overall balance assessment: Modified Independent                                           Pertinent Vitals/Pain Pain Assessment: 0-10 Pain Score: 3  Pain Descriptors / Indicators: Sore Pain Intervention(s):  Limited activity within patient's tolerance;Monitored during session;Premedicated before session;Ice applied    Home Living Family/patient expects to be discharged to:: Private residence Living Arrangements: Spouse/significant other Available Help at Discharge: Family   Home Access: Level entry     Home Layout: Two level;Able to live on main level with bedroom/bathroom Home Equipment: Gilford Rile - 2 wheels;Bedside commode      Prior Function Level of Independence: Independent               Hand Dominance        Extremity/Trunk Assessment   Upper Extremity Assessment Upper Extremity Assessment: Overall WFL for tasks assessed    Lower Extremity Assessment Lower Extremity Assessment: RLE deficits/detail RLE Deficits / Details: R hip flexion AAROM ~50*, hip abduction ~25* RLE Sensation: WNL RLE Coordination: WNL    Cervical / Trunk Assessment Cervical / Trunk Assessment: Normal  Communication   Communication: No difficulties  Cognition Arousal/Alertness: Awake/alert Behavior During Therapy: WFL for tasks assessed/performed Overall Cognitive Status: Within Functional Limits for tasks assessed                                        General Comments      Exercises Total Joint Exercises Ankle Circles/Pumps: AROM;Both;10 reps;Supine Heel Slides: AAROM;Right;10 reps;Supine Hip ABduction/ADduction: AAROM;Right;10 reps;Supine Long Arc Quad: AROM;Right;5 reps;Seated  Assessment/Plan    PT Assessment Patient needs continued PT services  PT Problem List Decreased strength;Decreased range of motion;Decreased activity tolerance;Decreased mobility;Pain       PT Treatment Interventions DME instruction;Gait training;Stair training;Functional mobility training;Therapeutic exercise;Therapeutic activities;Patient/family education    PT Goals (Current goals can be found in the Care Plan section)  Acute Rehab PT Goals Patient Stated Goal: yardwork, play  basketball PT Goal Formulation: With patient/family Time For Goal Achievement: 10/11/19 Potential to Achieve Goals: Good    Frequency 7X/week   Barriers to discharge        Co-evaluation               AM-PAC PT "6 Clicks" Mobility  Outcome Measure Help needed turning from your back to your side while in a flat bed without using bedrails?: A Little Help needed moving from lying on your back to sitting on the side of a flat bed without using bedrails?: A Little Help needed moving to and from a bed to a chair (including a wheelchair)?: A Little Help needed standing up from a chair using your arms (e.g., wheelchair or bedside chair)?: A Little Help needed to walk in hospital room?: A Little Help needed climbing 3-5 steps with a railing? : A Lot 6 Click Score: 17    End of Session Equipment Utilized During Treatment: Gait belt Activity Tolerance: Treatment limited secondary to medical complications (Comment)(nausea) Patient left: in chair;with call bell/phone within reach;with family/visitor present Nurse Communication: Mobility status PT Visit Diagnosis: Difficulty in walking, not elsewhere classified (R26.2);Pain;Muscle weakness (generalized) (M62.81) Pain - Right/Left: Right Pain - part of body: Hip    Time: [id number] PT Time Calculation (min) (ACUTE ONLY): 36 min   Charges:   PT Evaluation $PT Eval Low Complexity: 1 Low PT Treatments $Gait Training: 8-22 mins        Blondell Reveal Kistler PT 10/04/2019  Acute Rehabilitation Services Pager [phone] Office [phone]

## 2019-10-04 NOTE — Op Note (Signed)
NAME:  MANI RUCKERT NO.: [id number]      MEDICAL RECORD NO.: [id number]      FACILITY:  [facility]      PHYSICIAN:  Mauri Pole  DATE OF BIRTH:  03-05-1951     DATE OF PROCEDURE:  10/04/2019                                 OPERATIVE REPORT         PREOPERATIVE DIAGNOSIS: Right  hip osteoarthritis.      POSTOPERATIVE DIAGNOSIS:  Right hip osteoarthritis.      PROCEDURE:  Right total hip replacement through an anterior approach   utilizing DePuy THR system, component size 35mm pinnacle cup, a size 36+4 neutral   Altrex liner, a size 6 Hi  Actis stem with a 36+1.5 delta ceramic   ball.      SURGEON:  Pietro Cassis. Alvan Dame, M.D.      ASSISTANT:  Griffith Citron, PA-C     ANESTHESIA:  Spinal.      SPECIMENS:  None.      COMPLICATIONS:  None.      BLOOD LOSS:  500 cc     DRAINS:  None.      INDICATION OF THE PROCEDURE:  Jimmy Frazier is a 68 y.o. male who had   presented to office for evaluation of right hip pain.  Radiographs revealed   progressive degenerative changes with bone-on-bone   articulation of the  hip joint, including subchondral cystic changes and osteophytes.  The patient had painful limited range of   motion significantly affecting their overall quality of life and function.  The patient was failing to    respond to conservative measures including medications and/or injections and activity modification and at this point was ready   to proceed with more definitive measures.  Consent was obtained for   benefit of pain relief.  Specific risks of infection, DVT, component   failure, dislocation, neurovascular injury, and need for revision surgery were reviewed in the office as well discussion of   the anterior versus posterior approach were reviewed.     PROCEDURE IN DETAIL:  The patient was brought to operative theater.   Once adequate anesthesia, preoperative antibiotics, 2 gm of Ancef, 1 gm of Vancomycin, 1 gm of  Tranexamic Acid, and 10 mg of Decadron were administered, the patient was positioned supine on the [employer] table.  Once the patient was safely positioned with adequate padding of boney prominences we predraped out the hip, and used fluoroscopy to confirm orientation of the pelvis.      The right hip was then prepped and draped from proximal iliac crest to   mid thigh with a shower curtain technique.      Time-out was performed identifying the patient, planned procedure, and the appropriate extremity.     An incision was then made 2 cm lateral to the   anterior superior iliac spine extending over the orientation of the   tensor fascia lata muscle and sharp dissection was carried down to the   fascia of the muscle.      The fascia was then incised.  The muscle belly was identified and swept   laterally and retractor placed along the superior neck.  Following   cauterization of the circumflex  vessels and removing some pericapsular   fat, a second cobra retractor was placed on the inferior neck.  A T-capsulotomy was made along the line of the   superior neck to the trochanteric fossa, then extended proximally and   distally.  Tag sutures were placed and the retractors were then placed   intracapsular.  We then identified the trochanteric fossa and   orientation of my neck cut and then made a neck osteotomy with the femur on traction.  The femoral   head was removed without difficulty or complication.  Traction was let   off and retractors were placed posterior and anterior around the   acetabulum.      The labrum and foveal tissue were debrided.  I began reaming with a 46 mm   reamer and reamed up to 55 mm reamer with good bony bed preparation and a 56 mm  cup was chosen.  The final 56 mm Pinnacle cup was then impacted under fluoroscopy to confirm the depth of penetration and orientation with respect to   Abduction and forward flexion.  A screw was placed into the ilium followed by the hole  eliminator.  The final   36+4 neutral Altrex liner was impacted with good visualized rim fit.  The cup was positioned anatomically within the acetabular portion of the pelvis.      At this point, the femur was rolled to 100 degrees.  Further capsule was   released off the inferior aspect of the femoral neck.  I then   released the superior capsule proximally.  With the leg in a neutral position the hook was placed laterally   along the femur under the vastus lateralis origin and elevated manually and then held in position using the hook attachment on the bed.  The leg was then extended and adducted with the leg rolled to 100   degrees of external rotation.  Retractors were placed along the medial calcar and posteriorly over the greater trochanter.  Once the proximal femur was fully   exposed, I used a box osteotome to set orientation.  I then began   broaching with the starting chili pepper broach and passed this by hand and then broached up to 6.  With the 6 broach in place I chose a high offset neck and did several trial reductions.  The offset was appropriate, leg lengths   appeared to be equal best matched with the +1.5 head ball trial confirmed radiographically.   Given these findings, I went ahead and dislocated the hip, repositioned all   retractors and positioned the right hip in the extended and abducted position.  The final 6 Hi Actis stem was   chosen and it was impacted down to the level of neck cut.  Based on this   and the trial reductions, a final 36+1.5 delta ceramic ball was chosen and   impacted onto a clean and dry trunnion, and the hip was reduced.  The   hip had been irrigated throughout the case again at this point.  I did   reapproximate the superior capsular leaflet to the anterior leaflet   using #1 Vicryl.  The fascia of the   tensor fascia lata muscle was then reapproximated using #1 Vicryl and #0 Stratafix sutures.  The   remaining wound was closed with 2-0 Vicryl and  running 4-0 Monocryl.   The hip was cleaned, dried, and dressed sterilely using Dermabond and   Aquacel dressing.  The patient was then  brought   to recovery room in stable condition tolerating the procedure well.    Griffith Citron, PA-C was present for the entirety of the case involved from   preoperative positioning, perioperative retractor management, general   facilitation of the case, as well as primary wound closure as assistant.            Pietro Cassis Alvan Dame, M.D.        10/04/2019 12:19 PM

## 2019-10-04 NOTE — Anesthesia Preprocedure Evaluation (Addendum)
Anesthesia Evaluation  Patient identified by MRN, date of birth, ID band Patient awake    Reviewed: Allergy & Precautions, NPO status , Patient's Chart, lab work & pertinent test results  History of Anesthesia Complications Negative for: history of anesthetic complications  Airway Mallampati: I  TM Distance: >3 FB Neck ROM: Full    Dental  (+) Dental Advisory Given, Teeth Intact   Pulmonary neg pulmonary ROS,  10/01/2019 SARS CoV2 NEG   breath sounds clear to auscultation       Cardiovascular hypertension, Pt. on medications (-) angina Rhythm:Regular Rate:Normal     Neuro/Psych  Headaches,    GI/Hepatic Neg liver ROS, GERD  Medicated and Controlled,  Endo/Other  diabetes (glu 128), Oral Hypoglycemic Agents  Renal/GU Renal InsufficiencyRenal diseaseCreat 1.39     Musculoskeletal  (+) Arthritis , Osteoarthritis,    Abdominal   Peds  Hematology negative hematology ROS (+)   Anesthesia Other Findings   Reproductive/Obstetrics                            Anesthesia Physical Anesthesia Plan  ASA: II  Anesthesia Plan: Spinal   Post-op Pain Management:    Induction:   PONV Risk Score and Plan: 1 and Ondansetron and Treatment may vary due to age or medical condition  Airway Management Planned: Simple Face Mask and Natural Airway  Additional Equipment:   Intra-op Plan:   Post-operative Plan:   Informed Consent: I have reviewed the patients History and Physical, chart, labs and discussed the procedure including the risks, benefits and alternatives for the proposed anesthesia with the patient or authorized representative who has indicated his/her understanding and acceptance.     Dental advisory given  Plan Discussed with: CRNA and Surgeon  Anesthesia Plan Comments:         Anesthesia Quick Evaluation

## 2019-10-04 NOTE — Transfer of Care (Signed)
Immediate Anesthesia Transfer of Care Note  Patient: Jimmy Frazier  Procedure(s) Performed: Procedure(s) with comments: TOTAL HIP ARTHROPLASTY ANTERIOR APPROACH (Right) - 80mins  Patient Location: PACU  Anesthesia Type:Spinal  Level of Consciousness:  sedated, patient cooperative and responds to stimulation  Airway & Oxygen Therapy:Patient Spontanous Breathing and Patient connected to face mask oxgen  Post-op Assessment:  Report given to PACU RN and Post -op Vital signs reviewed and stable  Post vital signs:  Reviewed and stable  Last Vitals:  Vitals:   10/04/19 1108 10/04/19 1423  BP: 122/80   Pulse: 75   Resp: 18   Temp: 37 C (!) (P) 36.3 C  SpO2: [id number]     Complications: No apparent anesthesia complications

## 2019-10-04 NOTE — Anesthesia Postprocedure Evaluation (Signed)
Anesthesia Post Note  Patient: Jimmy Frazier  Procedure(s) Performed: TOTAL HIP ARTHROPLASTY ANTERIOR APPROACH (Right Hip)     Patient location during evaluation: PACU Anesthesia Type: Spinal Level of consciousness: awake and alert, patient cooperative and oriented Pain management: pain level controlled Vital Signs Assessment: post-procedure vital signs reviewed and stable Respiratory status: spontaneous breathing, nonlabored ventilation and respiratory function stable Cardiovascular status: blood pressure returned to baseline and stable Postop Assessment: spinal receding and no apparent nausea or vomiting Anesthetic complications: no    Last Vitals:  Vitals:   10/04/19 1615 10/04/19 1630  BP: 122/90 125/89  Pulse: 67 68  Resp: 10 16  Temp: (!) 36.4 C (!) 36.4 C  SpO2: 100% 100%    Last Pain:  Vitals:   10/04/19 1615  TempSrc:   PainSc: 4                  Teckla Christiansen,E. Kamil Mchaffie

## 2019-10-05 ENCOUNTER — Encounter (HOSPITAL_COMMUNITY): Payer: Self-pay | Admitting: Orthopedic Surgery

## 2019-10-05 LAB — BASIC METABOLIC PANEL
Anion gap: 11 (ref 5–15)
BUN: 17 mg/dL (ref 8–23)
CO2: 22 mmol/L (ref 22–32)
Calcium: 8.7 mg/dL — ABNORMAL LOW (ref 8.9–10.3)
Chloride: 103 mmol/L (ref 98–111)
Creatinine, Ser: 1.35 mg/dL — ABNORMAL HIGH (ref 0.61–1.24)
GFR calc Af Amer: >60 mL/min (ref >60)
GFR calc non Af Amer: 54 mL/min — ABNORMAL LOW (ref >60)
Glucose, Bld: 191 mg/dL — ABNORMAL HIGH (ref 70–99)
Potassium: 4.4 mmol/L (ref 3.5–5.1)
Sodium: 136 mmol/L (ref 135–145)

## 2019-10-05 LAB — CBC
HCT: 40.2 % (ref 39.0–52.0)
Hemoglobin: 13.1 g/dL (ref 13.0–17.0)
MCH: 28.7 pg (ref 26.0–34.0)
MCHC: 32.6 g/dL (ref 30.0–36.0)
MCV: 88.2 fL (ref 80.0–100.0)
Platelets: 220 10*3/uL (ref 150–400)
RBC: 4.56 MIL/uL (ref 4.22–5.81)
RDW: 14 % (ref 11.5–15.5)
WBC: 11.7 10*3/uL — ABNORMAL HIGH (ref 4.0–10.5)
nRBC: 0 % (ref 0.0–0.2)

## 2019-10-05 LAB — GLUCOSE, CAPILLARY: Glucose-Capillary: 222 mg/dL — ABNORMAL HIGH (ref 70–99)

## 2019-10-05 NOTE — Progress Notes (Signed)
Reviewed. Therapy Plan: HEP Has DME

## 2019-10-05 NOTE — Progress Notes (Signed)
Physical Therapy Treatment Patient Details Name: Jimmy Frazier MRN: [id number] DOB: 05/26/1951 Today's Date: 10/05/2019    History of Present Illness R DA-THA; PMH B TKA, L TSA, DM    PT Comments    Pt is progressing well with mobility and is ready to DC home from PT standpoint. He ambulated 200' with RW and demonstrates good understanding of THA HEP.   Follow Up Recommendations  Follow surgeon's recommendation for DC plan and follow-up therapies     Equipment Recommendations  None recommended by PT    Recommendations for Other Services       Precautions / Restrictions Precautions Precautions: Fall Restrictions Weight Bearing Restrictions: No    Mobility  Bed Mobility Overal bed mobility: Modified Independent Bed Mobility: Supine to Sit     Supine to sit: Modified independent (Device/Increase time);HOB elevated     General bed mobility comments: used rails, HOB up  Transfers Overall transfer level: Needs assistance Equipment used: Rolling walker (2 wheeled) Transfers: Sit to/from Stand Sit to Stand: Supervision         General transfer comment: VCs hand placement  Ambulation/Gait Ambulation/Gait assistance: Supervision Gait Distance (Feet): 200 Feet Assistive device: Rolling walker (2 wheeled) Gait Pattern/deviations: Step-through pattern;Decreased stride length;Trunk flexed Gait velocity: decr   General Gait Details: VCs sequencing, no loss of balance, VCs for posture (pt stated flexed trunk is baseline)   Stairs             Wheelchair Mobility    Modified Rankin (Stroke Patients Only)       Balance Overall balance assessment: Modified Independent                                          Cognition Arousal/Alertness: Awake/alert Behavior During Therapy: WFL for tasks assessed/performed Overall Cognitive Status: Within Functional Limits for tasks assessed                                         Exercises Total Joint Exercises Ankle Circles/Pumps: AROM;Both;10 reps;Supine Quad Sets: AROM;Right;5 reps;Supine Short Arc Quad: AROM;Right;10 reps;Supine Heel Slides: AAROM;Right;10 reps;Supine Hip ABduction/ADduction: AAROM;Right;10 reps;Supine;Standing(10 supine, 10 standing) Long Arc Quad: AROM;Right;Seated;10 reps Knee Flexion: AROM;Right;10 reps;Standing Marching in Standing: AROM;Right;5 reps;Standing Standing Hip Extension: AROM;Right;5 reps;Standing    General Comments        Pertinent Vitals/Pain Pain Score: 3  Pain Location: R hip Pain Descriptors / Indicators: Sore Pain Intervention(s): Limited activity within patient's tolerance;Monitored during session;Premedicated before session;Ice applied    Home Living                      Prior Function            PT Goals (current goals can now be found in the care plan section) Acute Rehab PT Goals Patient Stated Goal: yardwork, play basketball PT Goal Formulation: With patient Time For Goal Achievement: 10/11/19 Potential to Achieve Goals: Good Progress towards PT goals: Progressing toward goals    Frequency    7X/week      PT Plan Current plan remains appropriate    Co-evaluation              AM-PAC PT "6 Clicks" Mobility   Outcome Measure  Help needed turning from your back to your side  while in a flat bed without using bedrails?: None Help needed moving from lying on your back to sitting on the side of a flat bed without using bedrails?: A Little Help needed moving to and from a bed to a chair (including a wheelchair)?: None Help needed standing up from a chair using your arms (e.g., wheelchair or bedside chair)?: None Help needed to walk in hospital room?: None Help needed climbing 3-5 steps with a railing? : A Little 6 Click Score: 22    End of Session Equipment Utilized During Treatment: Gait belt Activity Tolerance: Patient tolerated treatment well Patient left: in chair;with  call bell/phone within reach Nurse Communication: Mobility status PT Visit Diagnosis: Difficulty in walking, not elsewhere classified (R26.2);Pain;Muscle weakness (generalized) (M62.81) Pain - Right/Left: Right Pain - part of body: Hip     Time: [id number] PT Time Calculation (min) (ACUTE ONLY): 30 min  Charges:  $Gait Training: 8-22 mins $Therapeutic Exercise: 8-22 mins                     Blondell Reveal Kistler PT 10/05/2019  Acute Rehabilitation Services Pager [phone] Office [phone]

## 2019-10-05 NOTE — Progress Notes (Signed)
     Subjective: 1 Day Post-Op Procedure(s) (LRB): TOTAL HIP ARTHROPLASTY ANTERIOR APPROACH (Right)   Patient reports pain as mild, pain controlled.  No events throughout the night. Dr. Alvan Dame discussed the procedure, findings and expectations moving forward. Ready to be discharged home, if he does well with PT.  Follow up in the clinic in 2 weeks. He knows to call with any questions or concerns.    Objective:   VITALS:   Vitals:   10/05/19 0110 10/05/19 0509  BP: 130/85 128/74  Pulse: (!) 103 (!) 108  Resp: 18 18  Temp: 99 F (37.2 C) 98.5 F (36.9 C)  SpO2: 100% 100%    Dorsiflexion/Plantar flexion intact Incision: dressing C/D/I No cellulitis present Compartment soft  LABS Recent Labs    10/03/19 1049 10/05/19 0229  HGB 15.4 13.1  HCT 45.6 40.2  WBC 5.9 11.7*  PLT 285 220    Recent Labs    10/03/19 1049 10/05/19 0229  NA 142 136  K 4.4 4.4  BUN 19 17  CREATININE 1.39* 1.35*  GLUCOSE 128* 191*     Assessment/Plan: 1 Day Post-Op Procedure(s) (LRB): TOTAL HIP ARTHROPLASTY ANTERIOR APPROACH (Right) Foley cath d/c'ed Advance diet Up with therapy D/C IV fluids Discharge home Follow up in 2 weeks at [facility] Follow up with OLIN,Pascuala Klutts D in 2 weeks.  Contact information:  [facility] [address] [place] [phone]    Obese (BMI 30-39.9) Estimated body mass index is 30.56 kg/m as calculated from the following:   Height as of this encounter: 5\' 10"  (1.778 m).   Weight as of this encounter: 96.6 kg. Patient also counseled that weight may inhibit the healing process Patient counseled that losing weight will help with future health issues         Jimmy Frazier. Jimmy Frazier   PAC  10/05/2019, 7:52 AM

## 2019-10-11 NOTE — Discharge Summary (Signed)
Physician Discharge Summary  Patient ID: Jimmy Frazier MRN: [id number] DOB/AGE: [birth year] 68 y.o.  Admit date: 10/04/2019 Discharge date: 10/05/2019   Procedures:  Procedure(s) (LRB): TOTAL HIP ARTHROPLASTY ANTERIOR APPROACH (Right)  Attending Physician:  Dr. Paralee Cancel   Admission Diagnoses:   Right hip primary OA / pain  Discharge Diagnoses:  Principal Problem:   S/P right THA, AA Active Problems:   Obese  Past Medical History:  Diagnosis Date   Arthritis    osteoarthritis   Back pain    occasionally   Diabetes mellitus without complication ([place])    borderline;but no meds required yet;diet and exercise   Diverticulosis    GERD (gastroesophageal reflux disease)    takes Omeprazole daily prn   Headache(784.0)    hx of cluster HA;but none in a while,02-05-16 no longer a problem   Hyperlipidemia    but doesn't require meds   Hypertension    takes Prinizide and Felodipine daily   Joint pain    Joint swelling    knees only   Muscle spasm    Robaxin daily prn    HPI:    Jimmy Frazier, 68 y.o. male, has a history of pain and functional disability in the right hip(s) due to arthritis and patient has failed non-surgical conservative treatments for greater than 12 weeks to include NSAID's and/or analgesics and activity modification.  Onset of symptoms was gradual starting  years ago with gradually worsening course since that time.The patient noted no past surgery on the right hip(s).  Patient currently rates pain in the right hip at 7 out of 10 with activity. Patient has night pain, worsening of pain with activity and weight bearing, trendelenberg gait, pain that interfers with activities of daily living and pain with passive range of motion. Patient has evidence of periarticular osteophytes and joint space narrowing by imaging studies. This condition presents safety issues increasing the risk of falls.  There is no current active infection.  Risks, benefits and  expectations were discussed with the patient.  Risks including but not limited to the risk of anesthesia, blood clots, nerve damage, blood vessel damage, failure of the prosthesis, infection and up to and including death.  Patient understand the risks, benefits and expectations and wishes to proceed with surgery.  PCP: Jefm Petty, MD   Discharged Condition: good  Hospital Course:  Patient underwent the above stated procedure on 10/04/2019. Patient tolerated the procedure well and brought to the recovery room in good condition and subsequently to the floor.  POD #1 BP: 128/74 ; Pulse: 108 ; Temp: 98.5 F (36.9 C) ; Resp: 18 Patient reports pain as mild, pain controlled.  No events throughout the night. Dr. Alvan Dame discussed the procedure, findings and expectations moving forward. Ready to be discharged home. Dorsiflexion/plantar flexion intact, incision: dressing C/D/I, no cellulitis present and compartment soft.   LABS  Basename    HGB     13.1  HCT     40.2    Discharge Exam: General appearance: alert, cooperative and no distress Extremities: Homans sign is negative, no sign of DVT, no edema, redness or tenderness in the calves or thighs and no ulcers, gangrene or trophic changes  Disposition:  Home with follow up in 2 weeks   Follow-up Information    Paralee Cancel, MD. Schedule an appointment as soon as possible for a visit in 2 weeks.   Specialty: Orthopedic Surgery Contact information: [address] [place] [address] [phone]  Discharge Instructions    Call MD / Call 911   Complete by: As directed    If you experience chest pain or shortness of breath, CALL 911 and be transported to the hospital emergency room.  If you develope a fever above 101 F, pus (white drainage) or increased drainage or redness at the wound, or calf pain, call your surgeon's office.   Change dressing   Complete by: As directed    Maintain surgical dressing  until follow up in the clinic. If the edges start to pull up, may reinforce with tape. If the dressing is no longer working, may remove and cover with gauze and tape, but must keep the area dry and clean.  Call with any questions or concerns.   Constipation Prevention   Complete by: As directed    Drink plenty of fluids.  Prune juice may be helpful.  You may use a stool softener, such as Colace (over the counter) 100 mg twice a day.  Use MiraLax (over the counter) for constipation as needed.   Diet - low sodium heart healthy   Complete by: As directed    Discharge instructions   Complete by: As directed    Maintain surgical dressing until follow up in the clinic. If the edges start to pull up, may reinforce with tape. If the dressing is no longer working, may remove and cover with gauze and tape, but must keep the area dry and clean.  Follow up in 2 weeks at [facility] Call with any questions or concerns.   Increase activity slowly as tolerated   Complete by: As directed    Weight bearing as tolerated with assist device (walker, cane, etc) as directed, use it as long as suggested by your surgeon or therapist, typically at least 4-6 weeks.   TED hose   Complete by: As directed    Use stockings (TED hose) for 2 weeks on both leg(s).  You may remove them at night for sleeping.      Allergies as of 10/05/2019   No Known Allergies     Medication List    STOP taking these medications   aspirin EC 81 MG tablet Replaced by: aspirin 81 MG chewable tablet   diclofenac Sodium 1 % Gel Commonly known as: VOLTAREN   ibuprofen 200 MG tablet Commonly known as: ADVIL     TAKE these medications   aspirin 81 MG chewable tablet Commonly known as: Aspirin Childrens Chew 1 tablet (81 mg total) by mouth 2 (two) times daily. Take for 4 weeks, then resume regular dose. Replaces: aspirin EC 81 MG tablet   docusate sodium 100 MG capsule Commonly known as: Colace Take 1 capsule (100 mg  total) by mouth 2 (two) times daily.   felodipine 5 MG 24 hr tablet Commonly known as: PLENDIL Take 5 mg by mouth daily.   ferrous sulfate 325 (65 FE) MG tablet Commonly known as: FerrouSul Take 1 tablet (325 mg total) by mouth 3 (three) times daily with meals for 14 days.   HYDROcodone-acetaminophen 7.5-325 MG tablet Commonly known as: Norco Take 1-2 tablets by mouth every 4 (four) hours as needed for moderate pain.   lisinopril-hydrochlorothiazide 10-12.5 MG tablet Commonly known as: ZESTORETIC Take 1 tablet by mouth daily.   metFORMIN 500 MG 24 hr tablet Commonly known as: GLUCOPHAGE-XR Take 1,000 mg by mouth daily.   methocarbamol 500 MG tablet Commonly known as: Robaxin Take 1 tablet (500 mg total) by mouth every 6 (six)  hours as needed for muscle spasms.   omeprazole 20 MG capsule Commonly known as: PRILOSEC Take 20 mg by mouth daily as needed (For heartburn or acid reflux.).   polyethylene glycol 17 g packet Commonly known as: MIRALAX / GLYCOLAX Take 17 g by mouth 2 (two) times daily.   sertraline 50 MG tablet Commonly known as: ZOLOFT Take 25 mg by mouth daily as needed (premature ejaculation).   simvastatin 20 MG tablet Commonly known as: ZOCOR Take 20 mg by mouth every morning.            Discharge Care Instructions  (From admission, onward)         Start     Ordered   10/04/19 0000  Change dressing    Comments: Maintain surgical dressing until follow up in the clinic. If the edges start to pull up, may reinforce with tape. If the dressing is no longer working, may remove and cover with gauze and tape, but must keep the area dry and clean.  Call with any questions or concerns.   10/04/19 1304           Signed: West Pugh. Natalee Tomkiewicz   PA-C  10/11/2019, 8:19 AM

## 2019-11-06 NOTE — H&P (Signed)
TOTAL HIP ADMISSION H&P  Patient is admitted for left total hip arthroplasty, anterior approach.  Subjective:  Chief Complaint:    Left hip primary OA / pain  HPI: Jimmy Frazier, 69 y.o. male, has a history of pain and functional disability in the left hip(s) due to arthritis and patient has failed non-surgical conservative treatments for greater than 12 weeks to include NSAID's and/or analgesics, use of assistive devices and activity modification.  Onset of symptoms was gradual starting 1 year ago with gradually worsening course since that time.The patient noted prior procedures of the hip to include arthroplasty on the right hip on Oct 04, 2019 per Dr. Alvan Dame.  Patient currently rates pain in the left hip at 6 out of 10 with activity. Patient has worsening of pain with activity and weight bearing, trendelenberg gait, pain that interfers with activities of daily living and pain with passive range of motion. Patient has evidence of periarticular osteophytes and joint space narrowing by imaging studies. This condition presents safety issues increasing the risk of falls.   There is no current active infection.  Risks, benefits and expectations were discussed with the patient.  Risks including but not limited to the risk of anesthesia, blood clots, nerve damage, blood vessel damage, failure of the prosthesis, infection and up to and including death.  Patient understand the risks, benefits and expectations and wishes to proceed with surgery.   PCP: Jefm Petty, MD  D/C Plans:       Home (same day)  Post-op Meds:       No Rx given   Tranexamic Acid:      To be given - IV   Decadron:      Is to be given  FYI:     ASA  Norco  DME:   Pt already has equipment  PT:   HEP  Pharmacy: [facility]    Patient Active Problem List   Diagnosis Date Noted  . S/P right THA, AA 10/04/2019  . S/P left TKA 03/25/2016  . Obese 02/13/2016  . S/P right TKA 02/12/2016   Past Medical History:  Diagnosis  Date  . Arthritis    osteoarthritis  . Back pain    occasionally  . Diabetes mellitus without complication ([place])    borderline;but no meds required yet;diet and exercise  . Diverticulosis   . GERD (gastroesophageal reflux disease)    takes Omeprazole daily prn  . Headache(784.0)    hx of cluster HA;but none in a while,02-05-16 no longer a problem  . Hyperlipidemia    but doesn't require meds  . Hypertension    takes Prinizide and Felodipine daily  . Joint pain   . Joint swelling    knees only  . Muscle spasm    Robaxin daily prn    Past Surgical History:  Procedure Laterality Date  . COLONOSCOPY    . ESOPHAGOGASTRODUODENOSCOPY    . KNEE ARTHROSCOPY     2 on right and 1 on left  . right shoulder arthroscopy    . TOTAL HIP ARTHROPLASTY Right 10/04/2019   Procedure: TOTAL HIP ARTHROPLASTY ANTERIOR APPROACH;  Surgeon: Paralee Cancel, MD;  Location: [facility];  Service: Orthopedics;  Laterality: Right;  80mins  . TOTAL KNEE ARTHROPLASTY Right 02/12/2016   Procedure: TOTAL RIGHT KNEE ARTHROPLASTY,INTRARTICULAR INJECTION LEFT KNEE;  Surgeon: Paralee Cancel, MD;  Location: [facility];  Service: Orthopedics;  Laterality: Right;  . TOTAL KNEE ARTHROPLASTY Left 03/25/2016   Procedure: LEFT TOTAL KNEE ARTHROPLASTY;  Surgeon:  Paralee Cancel, MD;  Location: [facility];  Service: Orthopedics;  Laterality: Left;  . TOTAL SHOULDER ARTHROPLASTY Left 02/25/2013   Procedure: LEFT TOTAL SHOULDER ARTHROPLASTY ;  Surgeon: Augustin Schooling, MD;  Location: [place] [facility] [place];  Service: Orthopedics;  Laterality: Left;    No current facility-administered medications for this encounter.   Current Outpatient Medications  Medication Sig Dispense Refill Last Dose  . docusate sodium (COLACE) 100 MG capsule Take 1 capsule (100 mg total) by mouth 2 (two) times daily. 28 capsule 0   . felodipine (PLENDIL) 5 MG 24 hr tablet Take 5 mg by mouth daily.     . ferrous sulfate (FERROUSUL) 325 (65 FE) MG tablet Take 1 tablet (325 mg total) by mouth 3  (three) times daily with meals for 14 days. 42 tablet 0   . HYDROcodone-acetaminophen (NORCO) 7.5-325 MG tablet Take 1-2 tablets by mouth every 4 (four) hours as needed for moderate pain. 60 tablet 0   . lisinopril-hydrochlorothiazide (ZESTORETIC) 10-12.5 MG tablet Take 1 tablet by mouth daily.     . metFORMIN (GLUCOPHAGE-XR) 500 MG 24 hr tablet Take 1,000 mg by mouth daily.      . methocarbamol (ROBAXIN) 500 MG tablet Take 1 tablet (500 mg total) by mouth every 6 (six) hours as needed for muscle spasms. 40 tablet 0   . omeprazole (PRILOSEC) 20 MG capsule Take 20 mg by mouth daily as needed (For heartburn or acid reflux.).      Marland Kitchen polyethylene glycol (MIRALAX / GLYCOLAX) 17 g packet Take 17 g by mouth 2 (two) times daily. 28 packet 0   . sertraline (ZOLOFT) 50 MG tablet Take 25 mg by mouth daily as needed (premature ejaculation).     . simvastatin (ZOCOR) 20 MG tablet Take 20 mg by mouth every morning.      No Known Allergies   Social History   Tobacco Use  . Smoking status: Never Smoker  . Smokeless tobacco: Never Used  Substance Use Topics  . Alcohol use: Yes    Comment: rarely       Review of Systems  Constitutional: Negative.   HENT: Negative.   Eyes: Negative.   Respiratory: Negative.   Cardiovascular: Negative.   Gastrointestinal: Negative.   Genitourinary: Negative.   Musculoskeletal: Positive for joint pain.  Skin: Negative.   Neurological: Negative.   Endo/Heme/Allergies: Negative.   Psychiatric/Behavioral: Negative.     Objective:  Physical Exam  Constitutional: He is oriented to person, place, and time. He appears well-developed.  HENT:  Head: Normocephalic.  Eyes: Pupils are equal, round, and reactive to light.  Neck: No JVD present. No tracheal deviation present. No thyromegaly present.  Cardiovascular: Normal rate, regular rhythm and intact distal pulses.  Respiratory: Effort normal and breath sounds normal. No respiratory distress. He has no wheezes.  GI:  Soft. There is no abdominal tenderness. There is no guarding.  Musculoskeletal:     Cervical back: Neck supple.     Left hip: Tenderness and bony tenderness present. No swelling, deformity or lacerations. Decreased range of motion. Decreased strength.  Lymphadenopathy:    He has no cervical adenopathy.  Neurological: He is alert and oriented to person, place, and time.  Skin: Skin is warm and dry.  Psychiatric: He has a normal mood and affect.     Labs:   Estimated body mass index is 30.56 kg/m as calculated from the following:   Height as of 10/04/19: 5\' 10"  (1.778 m).   Weight as of 10/04/19:  96.6 kg.   Imaging Review Plain radiographs demonstrate severe degenerative joint disease of the left hip(s). The bone quality appears to be good for age and reported activity level.      Assessment/Plan:  End stage arthritis, left hip  The patient history, physical examination, clinical judgement of the provider and imaging studies are consistent with end stage degenerative joint disease of the left hip(s) and total hip arthroplasty is deemed medically necessary. The treatment options including medical management, injection therapy, arthroscopy and arthroplasty were discussed at length. The risks and benefits of total hip arthroplasty were presented and reviewed. The risks due to aseptic loosening, infection, stiffness, dislocation/subluxation,  thromboembolic complications and other imponderables were discussed.  The patient acknowledged the explanation, agreed to proceed with the plan and consent was signed. Patient is being admitted for inpatient treatment for surgery, pain control, PT, OT, prophylactic antibiotics, VTE prophylaxis, progressive ambulation and ADL's and discharge planning.The patient is planning to be discharged home.    West Pugh Jens Siems   PA-C  11/06/2019, 7:49 PM

## 2019-11-22 ENCOUNTER — Inpatient Hospital Stay: Admit: 2019-11-22 | Payer: PPO | Admitting: Orthopedic Surgery

## 2019-11-22 SURGERY — ARTHROPLASTY, HIP, TOTAL, ANTERIOR APPROACH
Anesthesia: Spinal | Site: Hip | Laterality: Left

## 2019-12-07 DIAGNOSIS — Z471 Aftercare following joint replacement surgery: Secondary | ICD-10-CM | POA: Diagnosis not present

## 2019-12-07 DIAGNOSIS — Z96641 Presence of right artificial hip joint: Secondary | ICD-10-CM | POA: Diagnosis not present

## 2019-12-13 DIAGNOSIS — N401 Enlarged prostate with lower urinary tract symptoms: Secondary | ICD-10-CM | POA: Diagnosis not present

## 2019-12-13 DIAGNOSIS — E785 Hyperlipidemia, unspecified: Secondary | ICD-10-CM | POA: Diagnosis not present

## 2019-12-13 DIAGNOSIS — I10 Essential (primary) hypertension: Secondary | ICD-10-CM | POA: Diagnosis not present

## 2019-12-13 DIAGNOSIS — N182 Chronic kidney disease, stage 2 (mild): Secondary | ICD-10-CM | POA: Diagnosis not present

## 2019-12-13 DIAGNOSIS — R3911 Hesitancy of micturition: Secondary | ICD-10-CM | POA: Diagnosis not present

## 2019-12-13 DIAGNOSIS — Z79899 Other long term (current) drug therapy: Secondary | ICD-10-CM | POA: Diagnosis not present

## 2019-12-13 DIAGNOSIS — E1122 Type 2 diabetes mellitus with diabetic chronic kidney disease: Secondary | ICD-10-CM | POA: Diagnosis not present

## 2020-01-03 DIAGNOSIS — K219 Gastro-esophageal reflux disease without esophagitis: Secondary | ICD-10-CM | POA: Diagnosis not present

## 2020-01-03 DIAGNOSIS — E1122 Type 2 diabetes mellitus with diabetic chronic kidney disease: Secondary | ICD-10-CM | POA: Diagnosis not present

## 2020-01-03 DIAGNOSIS — I129 Hypertensive chronic kidney disease with stage 1 through stage 4 chronic kidney disease, or unspecified chronic kidney disease: Secondary | ICD-10-CM | POA: Diagnosis not present

## 2020-01-03 DIAGNOSIS — E785 Hyperlipidemia, unspecified: Secondary | ICD-10-CM | POA: Diagnosis not present

## 2020-01-03 DIAGNOSIS — Z7984 Long term (current) use of oral hypoglycemic drugs: Secondary | ICD-10-CM | POA: Diagnosis not present

## 2020-01-03 DIAGNOSIS — F524 Premature ejaculation: Secondary | ICD-10-CM | POA: Diagnosis not present

## 2020-01-03 DIAGNOSIS — N182 Chronic kidney disease, stage 2 (mild): Secondary | ICD-10-CM | POA: Diagnosis not present

## 2020-03-21 DIAGNOSIS — Z96641 Presence of right artificial hip joint: Secondary | ICD-10-CM | POA: Diagnosis not present

## 2020-03-21 DIAGNOSIS — Z471 Aftercare following joint replacement surgery: Secondary | ICD-10-CM | POA: Diagnosis not present

## 2020-03-21 DIAGNOSIS — M5416 Radiculopathy, lumbar region: Secondary | ICD-10-CM | POA: Diagnosis not present

## 2020-03-21 DIAGNOSIS — M545 Low back pain: Secondary | ICD-10-CM | POA: Diagnosis not present

## 2020-06-06 DIAGNOSIS — R600 Localized edema: Secondary | ICD-10-CM | POA: Diagnosis not present

## 2020-06-06 DIAGNOSIS — M79661 Pain in right lower leg: Secondary | ICD-10-CM | POA: Diagnosis not present

## 2020-06-13 DIAGNOSIS — Z7984 Long term (current) use of oral hypoglycemic drugs: Secondary | ICD-10-CM | POA: Diagnosis not present

## 2020-06-13 DIAGNOSIS — E1122 Type 2 diabetes mellitus with diabetic chronic kidney disease: Secondary | ICD-10-CM | POA: Diagnosis not present

## 2020-06-13 DIAGNOSIS — I129 Hypertensive chronic kidney disease with stage 1 through stage 4 chronic kidney disease, or unspecified chronic kidney disease: Secondary | ICD-10-CM | POA: Diagnosis not present

## 2020-06-13 DIAGNOSIS — E669 Obesity, unspecified: Secondary | ICD-10-CM | POA: Diagnosis not present

## 2020-06-13 DIAGNOSIS — Z0000 Encounter for general adult medical examination without abnormal findings: Secondary | ICD-10-CM | POA: Diagnosis not present

## 2020-06-13 DIAGNOSIS — N182 Chronic kidney disease, stage 2 (mild): Secondary | ICD-10-CM | POA: Diagnosis not present

## 2020-06-13 DIAGNOSIS — Z23 Encounter for immunization: Secondary | ICD-10-CM | POA: Diagnosis not present

## 2020-06-13 DIAGNOSIS — K219 Gastro-esophageal reflux disease without esophagitis: Secondary | ICD-10-CM | POA: Diagnosis not present

## 2020-06-13 DIAGNOSIS — R3911 Hesitancy of micturition: Secondary | ICD-10-CM | POA: Diagnosis not present

## 2020-06-13 DIAGNOSIS — E785 Hyperlipidemia, unspecified: Secondary | ICD-10-CM | POA: Diagnosis not present

## 2020-06-13 DIAGNOSIS — I10 Essential (primary) hypertension: Secondary | ICD-10-CM | POA: Diagnosis not present

## 2020-06-13 DIAGNOSIS — F524 Premature ejaculation: Secondary | ICD-10-CM | POA: Diagnosis not present

## 2020-06-13 DIAGNOSIS — N401 Enlarged prostate with lower urinary tract symptoms: Secondary | ICD-10-CM | POA: Diagnosis not present

## 2020-06-13 DIAGNOSIS — M79661 Pain in right lower leg: Secondary | ICD-10-CM | POA: Diagnosis not present

## 2020-06-14 DIAGNOSIS — S86111A Strain of other muscle(s) and tendon(s) of posterior muscle group at lower leg level, right leg, initial encounter: Secondary | ICD-10-CM | POA: Diagnosis not present

## 2020-07-16 DIAGNOSIS — Z1211 Encounter for screening for malignant neoplasm of colon: Secondary | ICD-10-CM | POA: Diagnosis not present

## 2020-07-16 DIAGNOSIS — D124 Benign neoplasm of descending colon: Secondary | ICD-10-CM | POA: Diagnosis not present

## 2020-07-16 DIAGNOSIS — Z8371 Family history of colonic polyps: Secondary | ICD-10-CM | POA: Diagnosis not present

## 2020-07-16 DIAGNOSIS — D123 Benign neoplasm of transverse colon: Secondary | ICD-10-CM | POA: Diagnosis not present

## 2020-07-16 DIAGNOSIS — K648 Other hemorrhoids: Secondary | ICD-10-CM | POA: Diagnosis not present

## 2020-07-16 DIAGNOSIS — D120 Benign neoplasm of cecum: Secondary | ICD-10-CM | POA: Diagnosis not present

## 2020-11-07 DIAGNOSIS — Z03818 Encounter for observation for suspected exposure to other biological agents ruled out: Secondary | ICD-10-CM | POA: Diagnosis not present

## 2020-12-18 DIAGNOSIS — E785 Hyperlipidemia, unspecified: Secondary | ICD-10-CM | POA: Diagnosis not present

## 2020-12-18 DIAGNOSIS — E1122 Type 2 diabetes mellitus with diabetic chronic kidney disease: Secondary | ICD-10-CM | POA: Diagnosis not present

## 2020-12-18 DIAGNOSIS — N1831 Chronic kidney disease, stage 3a: Secondary | ICD-10-CM | POA: Diagnosis not present

## 2020-12-18 DIAGNOSIS — N182 Chronic kidney disease, stage 2 (mild): Secondary | ICD-10-CM | POA: Diagnosis not present

## 2020-12-19 DIAGNOSIS — E1122 Type 2 diabetes mellitus with diabetic chronic kidney disease: Secondary | ICD-10-CM | POA: Diagnosis not present

## 2020-12-19 DIAGNOSIS — R202 Paresthesia of skin: Secondary | ICD-10-CM | POA: Diagnosis not present

## 2020-12-19 DIAGNOSIS — Z7984 Long term (current) use of oral hypoglycemic drugs: Secondary | ICD-10-CM | POA: Diagnosis not present

## 2020-12-19 DIAGNOSIS — G5602 Carpal tunnel syndrome, left upper limb: Secondary | ICD-10-CM | POA: Diagnosis not present

## 2020-12-19 DIAGNOSIS — Z23 Encounter for immunization: Secondary | ICD-10-CM | POA: Diagnosis not present

## 2020-12-19 DIAGNOSIS — I129 Hypertensive chronic kidney disease with stage 1 through stage 4 chronic kidney disease, or unspecified chronic kidney disease: Secondary | ICD-10-CM | POA: Diagnosis not present

## 2020-12-19 DIAGNOSIS — R200 Anesthesia of skin: Secondary | ICD-10-CM | POA: Diagnosis not present

## 2020-12-19 DIAGNOSIS — N1831 Chronic kidney disease, stage 3a: Secondary | ICD-10-CM | POA: Diagnosis not present

## 2020-12-19 DIAGNOSIS — E785 Hyperlipidemia, unspecified: Secondary | ICD-10-CM | POA: Diagnosis not present

## 2020-12-27 DIAGNOSIS — N189 Chronic kidney disease, unspecified: Secondary | ICD-10-CM | POA: Diagnosis not present

## 2020-12-27 DIAGNOSIS — N1831 Chronic kidney disease, stage 3a: Secondary | ICD-10-CM | POA: Diagnosis not present

## 2020-12-27 DIAGNOSIS — N281 Cyst of kidney, acquired: Secondary | ICD-10-CM | POA: Diagnosis not present

## 2021-01-31 DIAGNOSIS — M14672 Charcot's joint, left ankle and foot: Secondary | ICD-10-CM | POA: Diagnosis not present

## 2021-01-31 DIAGNOSIS — E1122 Type 2 diabetes mellitus with diabetic chronic kidney disease: Secondary | ICD-10-CM | POA: Diagnosis not present

## 2021-01-31 DIAGNOSIS — M19072 Primary osteoarthritis, left ankle and foot: Secondary | ICD-10-CM | POA: Diagnosis not present

## 2021-01-31 DIAGNOSIS — M7989 Other specified soft tissue disorders: Secondary | ICD-10-CM | POA: Diagnosis not present

## 2021-01-31 DIAGNOSIS — M79672 Pain in left foot: Secondary | ICD-10-CM | POA: Diagnosis not present

## 2021-01-31 DIAGNOSIS — N182 Chronic kidney disease, stage 2 (mild): Secondary | ICD-10-CM | POA: Diagnosis not present

## 2021-02-15 DIAGNOSIS — Z20822 Contact with and (suspected) exposure to covid-19: Secondary | ICD-10-CM | POA: Diagnosis not present

## 2021-02-15 DIAGNOSIS — Z20828 Contact with and (suspected) exposure to other viral communicable diseases: Secondary | ICD-10-CM | POA: Diagnosis not present

## 2021-02-27 DIAGNOSIS — U071 COVID-19: Secondary | ICD-10-CM | POA: Diagnosis not present

## 2021-03-07 DIAGNOSIS — U071 COVID-19: Secondary | ICD-10-CM | POA: Diagnosis not present

## 2021-03-14 DIAGNOSIS — E1122 Type 2 diabetes mellitus with diabetic chronic kidney disease: Secondary | ICD-10-CM | POA: Diagnosis not present

## 2021-03-14 DIAGNOSIS — M19079 Primary osteoarthritis, unspecified ankle and foot: Secondary | ICD-10-CM | POA: Diagnosis not present

## 2021-03-14 DIAGNOSIS — N182 Chronic kidney disease, stage 2 (mild): Secondary | ICD-10-CM | POA: Diagnosis not present

## 2021-03-26 DIAGNOSIS — Z20822 Contact with and (suspected) exposure to covid-19: Secondary | ICD-10-CM | POA: Diagnosis not present

## 2021-03-26 DIAGNOSIS — M25511 Pain in right shoulder: Secondary | ICD-10-CM | POA: Diagnosis not present

## 2021-05-17 ENCOUNTER — Other Ambulatory Visit: Payer: Self-pay | Admitting: Physician Assistant

## 2021-05-17 DIAGNOSIS — M25511 Pain in right shoulder: Secondary | ICD-10-CM

## 2021-05-29 DIAGNOSIS — M25511 Pain in right shoulder: Secondary | ICD-10-CM | POA: Diagnosis not present

## 2021-06-03 ENCOUNTER — Other Ambulatory Visit: Payer: Self-pay

## 2021-06-03 ENCOUNTER — Ambulatory Visit
Admission: RE | Admit: 2021-06-03 | Discharge: 2021-06-03 | Disposition: A | Discharge status: Home / Self Care | Payer: PPO | Source: Ambulatory Visit | Attending: Physician Assistant | Admitting: Physician Assistant

## 2021-06-03 DIAGNOSIS — M25511 Pain in right shoulder: Secondary | ICD-10-CM

## 2021-06-04 ENCOUNTER — Other Ambulatory Visit: Payer: PPO

## 2021-06-13 DIAGNOSIS — M75121 Complete rotator cuff tear or rupture of right shoulder, not specified as traumatic: Secondary | ICD-10-CM | POA: Diagnosis not present

## 2021-06-13 DIAGNOSIS — M25511 Pain in right shoulder: Secondary | ICD-10-CM | POA: Diagnosis not present

## 2021-06-18 DIAGNOSIS — N182 Chronic kidney disease, stage 2 (mild): Secondary | ICD-10-CM | POA: Diagnosis not present

## 2021-06-18 DIAGNOSIS — E1122 Type 2 diabetes mellitus with diabetic chronic kidney disease: Secondary | ICD-10-CM | POA: Diagnosis not present

## 2021-06-18 DIAGNOSIS — N1831 Chronic kidney disease, stage 3a: Secondary | ICD-10-CM | POA: Diagnosis not present

## 2021-06-18 DIAGNOSIS — E785 Hyperlipidemia, unspecified: Secondary | ICD-10-CM | POA: Diagnosis not present

## 2021-06-18 DIAGNOSIS — R3911 Hesitancy of micturition: Secondary | ICD-10-CM | POA: Diagnosis not present

## 2021-06-18 DIAGNOSIS — I10 Essential (primary) hypertension: Secondary | ICD-10-CM | POA: Diagnosis not present

## 2021-06-18 DIAGNOSIS — N401 Enlarged prostate with lower urinary tract symptoms: Secondary | ICD-10-CM | POA: Diagnosis not present

## 2021-06-19 DIAGNOSIS — N1831 Chronic kidney disease, stage 3a: Secondary | ICD-10-CM | POA: Diagnosis not present

## 2021-06-19 DIAGNOSIS — I129 Hypertensive chronic kidney disease with stage 1 through stage 4 chronic kidney disease, or unspecified chronic kidney disease: Secondary | ICD-10-CM | POA: Diagnosis not present

## 2021-06-19 DIAGNOSIS — Z0000 Encounter for general adult medical examination without abnormal findings: Secondary | ICD-10-CM | POA: Diagnosis not present

## 2021-06-19 DIAGNOSIS — E669 Obesity, unspecified: Secondary | ICD-10-CM | POA: Diagnosis not present

## 2021-06-19 DIAGNOSIS — F524 Premature ejaculation: Secondary | ICD-10-CM | POA: Diagnosis not present

## 2021-06-19 DIAGNOSIS — E1122 Type 2 diabetes mellitus with diabetic chronic kidney disease: Secondary | ICD-10-CM | POA: Diagnosis not present

## 2021-06-19 DIAGNOSIS — Z6830 Body mass index (BMI) 30.0-30.9, adult: Secondary | ICD-10-CM | POA: Diagnosis not present

## 2021-06-19 DIAGNOSIS — E785 Hyperlipidemia, unspecified: Secondary | ICD-10-CM | POA: Diagnosis not present

## 2021-06-19 DIAGNOSIS — N401 Enlarged prostate with lower urinary tract symptoms: Secondary | ICD-10-CM | POA: Diagnosis not present

## 2021-06-19 DIAGNOSIS — R3911 Hesitancy of micturition: Secondary | ICD-10-CM | POA: Diagnosis not present

## 2021-10-11 DIAGNOSIS — Z96641 Presence of right artificial hip joint: Secondary | ICD-10-CM | POA: Diagnosis not present

## 2021-10-11 DIAGNOSIS — M25552 Pain in left hip: Secondary | ICD-10-CM | POA: Diagnosis not present

## 2021-10-11 DIAGNOSIS — M1612 Unilateral primary osteoarthritis, left hip: Secondary | ICD-10-CM | POA: Diagnosis not present

## 2021-11-19 NOTE — Patient Instructions (Addendum)
DUE TO COVID-19 ONLY ONE VISITOR IS ALLOWED TO COME WITH YOU AND STAY IN THE WAITING ROOM ONLY DURING PRE OP AND PROCEDURE.   **NO VISITORS ARE ALLOWED IN THE SHORT STAY AREA OR RECOVERY ROOM!!**  IF YOU WILL BE ADMITTED INTO THE HOSPITAL YOU ARE ALLOWED ONLY TWO SUPPORT PEOPLE DURING VISITATION HOURS ONLY (7 AM -8PM)   The support person(s) must pass our screening, gel in and out, and wear a mask at all times, including in the patients room. Patients must also wear a mask when staff or their support person are in the room. Visitors GUEST BADGE MUST BE WORN VISIBLY  One adult visitor may remain with you overnight and MUST be in the room by 8 P.M.  No visitors under the age of 73. Any visitor under the age of 21 must be accompanied by an adult.    COVID SWAB TESTING MUST BE COMPLETED ON:  11/29/21 @ 9:00 AM   Site: [facility] [place] Enter: Main Entrance have a seat in the waiting area to the right of main entrance (DO NOT [place]!!!!!) Dial: [phone] to alert staff you have arrived  You are not required to quarantine, however you are required to wear a well-fitted mask when you are out and around people not in your household.  Hand Hygiene often Do NOT share personal items Notify your provider if you are in close contact with someone who has COVID or you develop fever 100.4 or greater, new onset of sneezing, cough, sore throat, shortness of breath or body aches.       Your procedure is scheduled on: 12/03/21    Report to [facility] Main Entrance    Report to admitting at 11:30 AM   Call this number if you have problems the morning of surgery [phone]   Do not eat food :After Midnight.   May have liquids until 11:20 AM day of surgery  CLEAR LIQUID DIET  Foods Allowed                                                                     Foods Excluded  Water, Black Coffee and tea (no milk or creamer)             liquids that you cannot  Plain Jell-O in any flavor  (No red)                                     see through such as: Fruit ices (not with fruit pulp)                                             milk, soups, orange juice              Iced Popsicles (No red)  All solid food                                   Apple juices Sports drinks like Gatorade (No red) Lightly seasoned clear broth or consume(fat free) Sugar    The day of surgery:  Drink ONE (1) Pre-Surgery G2 at 11:20 AM the morning of surgery. Drink in one sitting. Do not sip.  This drink was given to you during your hospital  pre-op appointment visit. Nothing else to drink after completing the  Pre-Surgery G2.          If you have questions, please contact your surgeons office.     Oral Hygiene is also important to reduce your risk of infection.                                    Remember - BRUSH YOUR TEETH THE MORNING OF SURGERY WITH YOUR REGULAR TOOTHPASTE   Take these medicines the morning of surgery with A SIP OF WATER: Felodipine, Omeprazole, Zoloft, Simvastatin  DO NOT TAKE ANY ORAL DIABETIC MEDICATIONS DAY OF YOUR SURGERY  How to Manage Your Diabetes Before and After Surgery  Why is it important to control my blood sugar before and after surgery? Improving blood sugar levels before and after surgery helps healing and can limit problems. A way of improving blood sugar control is eating a healthy diet by:  Eating less sugar and carbohydrates  Increasing activity/exercise  Talking with your doctor about reaching your blood sugar goals High blood sugars (greater than 180 mg/dL) can raise your risk of infections and slow your recovery, so you will need to focus on controlling your diabetes during the weeks before surgery. Make sure that the doctor who takes care of your diabetes knows about your planned surgery including the date and location.  How do I manage my blood sugar  before surgery? Check your blood sugar at least 4 times a day, starting 2 days before surgery, to make sure that the level is not too high or low. Check your blood sugar the morning of your surgery when you wake up and every 2 hours until you get to the Short Stay unit. If your blood sugar is less than 70 mg/dL, you will need to treat for low blood sugar: Do not take insulin. Treat a low blood sugar (less than 70 mg/dL) with  cup of clear juice (cranberry or apple), 4 glucose tablets, OR glucose gel. Recheck blood sugar in 15 minutes after treatment (to make sure it is greater than 70 mg/dL). If your blood sugar is not greater than 70 mg/dL on recheck, call [phone] for further instructions. Report your blood sugar to the short stay nurse when you get to Short Stay.  If you are admitted to the hospital after surgery: Your blood sugar will be checked by the staff and you will probably be given insulin after surgery (instead of oral diabetes medicines) to make sure you have good blood sugar levels. The goal for blood sugar control after surgery is 80-180 mg/dL.   WHAT DO I DO ABOUT MY DIABETES MEDICATION?  Do not take oral diabetes medicines (pills) the morning of surgery.  THE DAY BEFORE SURGERY, take Metformin as prescribed.       THE MORNING OF SURGERY, do not take Metformin    Reviewed and  Endorsed by [facility] Patient Education Committee, August 2015                               You may not have any metal on your body including jewelry, and body piercing             Do not wear lotions, powders, cologne, or deodorant              Men may shave face and neck.   Do not bring valuables to the hospital. [place].   Bring small overnight bag day of surgery.   Special Instructions: Bring a copy of your healthcare power of attorney and living will documents         the day of surgery if you haven't scanned them before.               Please read over the following fact sheets you were given: IF YOU HAVE QUESTIONS ABOUT YOUR PRE-OP INSTRUCTIONS PLEASE CALL [facility] - Preparing for Surgery Before surgery, you can play an important role.  Because skin is not sterile, your skin needs to be as free of germs as possible.  You can reduce the number of germs on your skin by washing with CHG (chlorahexidine gluconate) soap before surgery.  CHG is an antiseptic cleaner which kills germs and bonds with the skin to continue killing germs even after washing. Please DO NOT use if you have an allergy to CHG or antibacterial soaps.  If your skin becomes reddened/irritated stop using the CHG and inform your nurse when you arrive at Short Stay. Do not shave (including legs and underarms) for at least 48 hours prior to the first CHG shower.  You may shave your face/neck.  Please follow these instructions carefully:  1.  Shower with CHG Soap the night before surgery and the  morning of surgery.  2.  If you choose to wash your hair, wash your hair first as usual with your normal  shampoo.  3.  After you shampoo, rinse your hair and body thoroughly to remove the shampoo.                             4.  Use CHG as you would any other liquid soap.  You can apply chg directly to the skin and wash.  Gently with a scrungie or clean washcloth.  5.  Apply the CHG Soap to your body ONLY FROM THE NECK DOWN.   Do   not use on face/ open                           Wound or open sores. Avoid contact with eyes, ears mouth and   genitals (private parts).                       Wash face,  Genitals (private parts) with your normal soap.             6.  Wash thoroughly, paying special attention to the area where your    surgery  will be performed.  7.  Thoroughly rinse your body with warm water from the neck down.  8.  DO NOT shower/wash with your normal soap after using and rinsing off the CHG Soap.                9.  Pat yourself  dry with a clean towel.            10.  Wear clean pajamas.            11.  Place clean sheets on your bed the night of your first shower and do not  sleep with pets. Day of Surgery : Do not apply any lotions/deodorants the morning of surgery.  Please wear clean clothes to the hospital/surgery center.  FAILURE TO FOLLOW THESE INSTRUCTIONS MAY RESULT IN THE CANCELLATION OF YOUR SURGERY  PATIENT SIGNATURE_________________________________  NURSE SIGNATURE__________________________________  ________________________________________________________________________   Jimmy Frazier  An incentive spirometer is a tool that can help keep your lungs clear and active. This tool measures how well you are filling your lungs with each breath. Taking long deep breaths may help reverse or decrease the chance of developing breathing (pulmonary) problems (especially infection) following: A long period of time when you are unable to move or be active. BEFORE THE PROCEDURE  If the spirometer includes an indicator to show your best effort, your nurse or respiratory therapist will set it to a desired goal. If possible, sit up straight or lean slightly forward. Try not to slouch. Hold the incentive spirometer in an upright position. INSTRUCTIONS FOR USE  Sit on the edge of your bed if possible, or sit up as far as you can in bed or on a chair. Hold the incentive spirometer in an upright position. Breathe out normally. Place the mouthpiece in your mouth and seal your lips tightly around it. Breathe in slowly and as deeply as possible, raising the piston or the ball toward the top of the column. Hold your breath for 3-5 seconds or for as long as possible. Allow the piston or ball to fall to the bottom of the column. Remove the mouthpiece from your mouth and breathe out normally. Rest for a few seconds and repeat Steps 1 through 7 at least 10 times every 1-2 hours when you are awake. Take your time and take  a few normal breaths between deep breaths. The spirometer may include an indicator to show your best effort. Use the indicator as a goal to work toward during each repetition. After each set of 10 deep breaths, practice coughing to be sure your lungs are clear. If you have an incision (the cut made at the time of surgery), support your incision when coughing by placing a pillow or rolled up towels firmly against it. Once you are able to get out of bed, walk around indoors and cough well. You may stop using the incentive spirometer when instructed by your caregiver.  RISKS AND COMPLICATIONS Take your time so you do not get dizzy or light-headed. If you are in pain, you may need to take or ask for pain medication before doing incentive spirometry. It is harder to take a deep breath if you are having pain. AFTER USE Rest and breathe slowly and easily. It can be helpful to keep track of a log of your progress. Your caregiver can provide you with a simple table to help with this. If you are using the spirometer at home, follow these instructions: [place] IF:  You are having difficultly using the spirometer. You have trouble using the spirometer as often as instructed. Your pain  medication is not giving enough relief while using the spirometer. You develop fever of 100.5 F (38.1 C) or higher. SEEK IMMEDIATE MEDICAL CARE IF:  You cough up bloody sputum that had not been present before. You develop fever of 102 F (38.9 C) or greater. You develop worsening pain at or near the incision site. MAKE SURE YOU:  Understand these instructions. Will watch your condition. Will get help right away if you are not doing well or get worse. Document Released: 03/02/2007 Document Revised: 01/12/2012 Document Reviewed: 05/03/2007 ExitCare Patient Information 2014 Exit[employer], [place].   ________________________________________________________________________  WHAT IS A BLOOD TRANSFUSION? Blood  Transfusion Information  A transfusion is the replacement of blood or some of its parts. Blood is made up of multiple cells which provide different functions. Red blood cells carry oxygen and are used for blood loss replacement. White blood cells fight against infection. Platelets control bleeding. Plasma helps clot blood. Other blood products are available for specialized needs, such as hemophilia or other clotting disorders. BEFORE THE TRANSFUSION  Who gives blood for transfusions?  Healthy volunteers who are fully evaluated to make sure their blood is safe. This is blood bank blood. Transfusion therapy is the safest it has ever been in the practice of medicine. Before blood is taken from a donor, a complete history is taken to make sure that person has no history of diseases nor engages in risky social behavior (examples are intravenous drug use or sexual activity with multiple partners). The donor's travel history is screened to minimize risk of transmitting infections, such as malaria. The donated blood is tested for signs of infectious diseases, such as HIV and hepatitis. The blood is then tested to be sure it is compatible with you in order to minimize the chance of a transfusion reaction. If you or a relative donates blood, this is often done in anticipation of surgery and is not appropriate for emergency situations. It takes many days to process the donated blood. RISKS AND COMPLICATIONS Although transfusion therapy is very safe and saves many lives, the main dangers of transfusion include:  Getting an infectious disease. Developing a transfusion reaction. This is an allergic reaction to something in the blood you were given. Every precaution is taken to prevent this. The decision to have a blood transfusion has been considered carefully by your caregiver before blood is given. Blood is not given unless the benefits outweigh the risks. AFTER THE TRANSFUSION Right after receiving a blood  transfusion, you will usually feel much better and more energetic. This is especially true if your red blood cells have gotten low (anemic). The transfusion raises the level of the red blood cells which carry oxygen, and this usually causes an energy increase. The nurse administering the transfusion will monitor you carefully for complications. HOME CARE INSTRUCTIONS  No special instructions are needed after a transfusion. You may find your energy is better. Speak with your caregiver about any limitations on activity for underlying diseases you may have. SEEK MEDICAL CARE IF:  Your condition is not improving after your transfusion. You develop redness or irritation at the intravenous (IV) site. SEEK IMMEDIATE MEDICAL CARE IF:  Any of the following symptoms occur over the next 12 hours: Shaking chills. You have a temperature by mouth above 102 F (38.9 C), not controlled by medicine. Chest, back, or muscle pain. People around you feel you are not acting correctly or are confused. Shortness of breath or difficulty breathing. Dizziness and fainting. You get a rash or  develop hives. You have a decrease in urine output. Your urine turns a dark color or changes to pink, red, or brown. Any of the following symptoms occur over the next 10 days: You have a temperature by mouth above 102 F (38.9 C), not controlled by medicine. Shortness of breath. Weakness after normal activity. The white part of the eye turns yellow (jaundice). You have a decrease in the amount of urine or are urinating less often. Your urine turns a dark color or changes to pink, red, or brown. Document Released: 10/17/2000 Document Revised: 01/12/2012 Document Reviewed: 06/05/2008 [facility] [place] Patient Information 2014 [place], [place].  _______________________________________________________________________

## 2021-11-19 NOTE — Progress Notes (Addendum)
COVID swab appointment: 11/29/21 @ 0900  COVID Vaccine Completed: yes x4 Date COVID Vaccine completed: 11/23/19, 12/14/19 Has received booster: 08/27/20, 02/13/21 COVID vaccine manufacturer: Pfizer       Date of COVID positive in last 90 days: no  PCP - Jefm Petty, MD Cardiologist - n/a  Chest x-ray - n/a EKG - 11/20/21 Epic/chart Stress Test - n/a ECHO - n/a Cardiac Cath - n/a Pacemaker/ICD device last checked: n/a Spinal Cord Stimulator: n/a  Sleep Study - n/a CPAP -   Fasting Blood Sugar - 115-140 Checks Blood Sugar _1_ times a day  Blood Thinner Instructions: n/a Aspirin Instructions: Last Dose:  Activity level: Can go up a flight of stairs and perform activities of daily living without stopping and without symptoms of chest pain or shortness of breath.    Anesthesia review:   Patient denies shortness of breath, fever, cough and chest pain at PAT appointment   Patient verbalized understanding of instructions that were given to them at the PAT appointment. Patient was also instructed that they will need to review over the PAT instructions again at home before surgery.

## 2021-11-20 ENCOUNTER — Encounter (HOSPITAL_COMMUNITY)
Admission: RE | Admit: 2021-11-20 | Discharge: 2021-11-20 | Disposition: A | Discharge status: Home / Self Care | Payer: PPO | Source: Ambulatory Visit | Attending: Orthopedic Surgery | Admitting: Orthopedic Surgery

## 2021-11-20 ENCOUNTER — Encounter (HOSPITAL_COMMUNITY): Payer: Self-pay

## 2021-11-20 ENCOUNTER — Other Ambulatory Visit: Payer: Self-pay

## 2021-11-20 ENCOUNTER — Encounter (INDEPENDENT_AMBULATORY_CARE_PROVIDER_SITE_OTHER): Payer: Self-pay

## 2021-11-20 VITALS — BP 124/85 | HR 75 | Temp 98.8°F | Resp 14 | Ht 70.0 in | Wt 206.8 lb

## 2021-11-20 DIAGNOSIS — M1612 Unilateral primary osteoarthritis, left hip: Secondary | ICD-10-CM | POA: Diagnosis not present

## 2021-11-20 DIAGNOSIS — Z01818 Encounter for other preprocedural examination: Secondary | ICD-10-CM | POA: Diagnosis not present

## 2021-11-20 DIAGNOSIS — E119 Type 2 diabetes mellitus without complications: Secondary | ICD-10-CM | POA: Diagnosis not present

## 2021-11-20 LAB — CBC
HCT: 43.9 % (ref 39.0–52.0)
Hemoglobin: 14.4 g/dL (ref 13.0–17.0)
MCH: 28.5 pg (ref 26.0–34.0)
MCHC: 32.8 g/dL (ref 30.0–36.0)
MCV: 86.8 fL (ref 80.0–100.0)
Platelets: 203 10*3/uL (ref 150–400)
RBC: 5.06 MIL/uL (ref 4.22–5.81)
RDW: 14.3 % (ref 11.5–15.5)
WBC: 6.1 10*3/uL (ref 4.0–10.5)
nRBC: 0 % (ref 0.0–0.2)

## 2021-11-20 LAB — TYPE AND SCREEN
ABO/RH(D): O POS
Antibody Screen: NEGATIVE

## 2021-11-20 LAB — COMPREHENSIVE METABOLIC PANEL
ALT: 17 U/L (ref 0–44)
AST: 16 U/L (ref 15–41)
Albumin: 3.7 g/dL (ref 3.5–5.0)
Alkaline Phosphatase: 75 U/L (ref 38–126)
Anion gap: 6 (ref 5–15)
BUN: 19 mg/dL (ref 8–23)
CO2: 25 mmol/L (ref 22–32)
Calcium: 8.9 mg/dL (ref 8.9–10.3)
Chloride: 108 mmol/L (ref 98–111)
Creatinine, Ser: 1.3 mg/dL — ABNORMAL HIGH (ref 0.61–1.24)
GFR, Estimated: 60 mL/min — ABNORMAL LOW (ref >60)
Glucose, Bld: 144 mg/dL — ABNORMAL HIGH (ref 70–99)
Potassium: 4 mmol/L (ref 3.5–5.1)
Sodium: 139 mmol/L (ref 135–145)
Total Bilirubin: 1 mg/dL (ref 0.3–1.2)
Total Protein: 6.6 g/dL (ref 6.5–8.1)

## 2021-11-20 LAB — SURGICAL PCR SCREEN
MRSA, PCR: NEGATIVE
Staphylococcus aureus: NEGATIVE

## 2021-11-20 LAB — GLUCOSE, CAPILLARY: Glucose-Capillary: 137 mg/dL — ABNORMAL HIGH (ref 70–99)

## 2021-11-20 LAB — HEMOGLOBIN A1C
Hgb A1c MFr Bld: 6.4 % — ABNORMAL HIGH (ref 4.8–5.6)
Mean Plasma Glucose: 136.98 mg/dL

## 2021-11-29 ENCOUNTER — Encounter (HOSPITAL_COMMUNITY)
Admission: RE | Admit: 2021-11-29 | Discharge: 2021-11-29 | Disposition: A | Discharge status: Home / Self Care | Payer: PPO | Source: Ambulatory Visit | Attending: Orthopedic Surgery | Admitting: Orthopedic Surgery

## 2021-11-29 ENCOUNTER — Other Ambulatory Visit: Payer: Self-pay

## 2021-11-29 DIAGNOSIS — Z01812 Encounter for preprocedural laboratory examination: Secondary | ICD-10-CM | POA: Diagnosis not present

## 2021-11-29 DIAGNOSIS — Z20822 Contact with and (suspected) exposure to covid-19: Secondary | ICD-10-CM | POA: Diagnosis not present

## 2021-11-29 DIAGNOSIS — Z01818 Encounter for other preprocedural examination: Secondary | ICD-10-CM

## 2021-11-29 LAB — SARS CORONAVIRUS 2 (TAT 6-24 HRS): SARS Coronavirus 2: NEGATIVE

## 2021-12-02 ENCOUNTER — Encounter (HOSPITAL_COMMUNITY): Payer: Self-pay | Admitting: Orthopedic Surgery

## 2021-12-03 ENCOUNTER — Other Ambulatory Visit: Payer: Self-pay

## 2021-12-03 ENCOUNTER — Observation Stay (HOSPITAL_COMMUNITY)
Admission: RE | Admit: 2021-12-03 | Discharge: 2021-12-04 | Disposition: A | Discharge status: Home / Self Care | Payer: PPO | Source: Ambulatory Visit | Attending: Orthopedic Surgery | Admitting: Orthopedic Surgery

## 2021-12-03 ENCOUNTER — Encounter (HOSPITAL_COMMUNITY)
Admission: RE | Disposition: A | Discharge status: Home / Self Care | Payer: Self-pay | Source: Ambulatory Visit | Attending: Orthopedic Surgery

## 2021-12-03 ENCOUNTER — Ambulatory Visit (HOSPITAL_COMMUNITY): Payer: PPO | Admitting: Anesthesiology

## 2021-12-03 ENCOUNTER — Observation Stay (HOSPITAL_COMMUNITY): Payer: PPO

## 2021-12-03 ENCOUNTER — Ambulatory Visit (HOSPITAL_COMMUNITY): Payer: PPO

## 2021-12-03 ENCOUNTER — Encounter (HOSPITAL_COMMUNITY): Payer: Self-pay | Admitting: Orthopedic Surgery

## 2021-12-03 DIAGNOSIS — Z96612 Presence of left artificial shoulder joint: Secondary | ICD-10-CM | POA: Diagnosis not present

## 2021-12-03 DIAGNOSIS — Z96641 Presence of right artificial hip joint: Secondary | ICD-10-CM | POA: Diagnosis not present

## 2021-12-03 DIAGNOSIS — Z7984 Long term (current) use of oral hypoglycemic drugs: Secondary | ICD-10-CM | POA: Insufficient documentation

## 2021-12-03 DIAGNOSIS — E119 Type 2 diabetes mellitus without complications: Secondary | ICD-10-CM | POA: Insufficient documentation

## 2021-12-03 DIAGNOSIS — Z96653 Presence of artificial knee joint, bilateral: Secondary | ICD-10-CM | POA: Insufficient documentation

## 2021-12-03 DIAGNOSIS — Z96642 Presence of left artificial hip joint: Secondary | ICD-10-CM

## 2021-12-03 DIAGNOSIS — Z96649 Presence of unspecified artificial hip joint: Secondary | ICD-10-CM

## 2021-12-03 DIAGNOSIS — I10 Essential (primary) hypertension: Secondary | ICD-10-CM | POA: Insufficient documentation

## 2021-12-03 DIAGNOSIS — M1612 Unilateral primary osteoarthritis, left hip: Secondary | ICD-10-CM | POA: Diagnosis not present

## 2021-12-03 DIAGNOSIS — Z79899 Other long term (current) drug therapy: Secondary | ICD-10-CM | POA: Insufficient documentation

## 2021-12-03 HISTORY — PX: TOTAL HIP ARTHROPLASTY: SHX124

## 2021-12-03 LAB — GLUCOSE, CAPILLARY
Glucose-Capillary: 120 mg/dL — ABNORMAL HIGH (ref 70–99)
Glucose-Capillary: 147 mg/dL — ABNORMAL HIGH (ref 70–99)
Glucose-Capillary: 200 mg/dL — ABNORMAL HIGH (ref 70–99)

## 2021-12-03 SURGERY — ARTHROPLASTY, HIP, TOTAL, ANTERIOR APPROACH
Anesthesia: General | Site: Hip | Laterality: Left

## 2021-12-03 MED ORDER — CEFAZOLIN SODIUM-DEXTROSE 2-4 GM/100ML-% IV SOLN
2.0000 g | Freq: Four times a day (QID) | INTRAVENOUS | Status: AC
  Administered 2021-12-03 – 2021-12-04 (×2): 2 g via INTRAVENOUS
  Filled 2021-12-03 (×2): qty 100

## 2021-12-03 MED ORDER — DEXAMETHASONE SODIUM PHOSPHATE 10 MG/ML IJ SOLN: 8.0000 mg | Freq: Once | INTRAMUSCULAR | Status: DC

## 2021-12-03 MED ORDER — FENTANYL CITRATE (PF) 100 MCG/2ML IJ SOLN
INTRAMUSCULAR | Status: DC | PRN
  Administered 2021-12-03 (×2): 50 ug via INTRAVENOUS
  Administered 2021-12-03 (×5): 25 ug via INTRAVENOUS

## 2021-12-03 MED ORDER — TRANEXAMIC ACID-NACL 1000-0.7 MG/100ML-% IV SOLN
1000.0000 mg | Freq: Once | INTRAVENOUS | Status: AC
  Administered 2021-12-03: 1000 mg via INTRAVENOUS
  Filled 2021-12-03: qty 100

## 2021-12-03 MED ORDER — INSULIN ASPART 100 UNIT/ML IJ SOLN: 0.0000 [IU] | Freq: Three times a day (TID) | INTRAMUSCULAR | Status: DC

## 2021-12-03 MED ORDER — HYDROMORPHONE HCL 1 MG/ML IJ SOLN
INTRAMUSCULAR | Status: AC
  Filled 2021-12-03: qty 1

## 2021-12-03 MED ORDER — MIDAZOLAM HCL 5 MG/5ML IJ SOLN
INTRAMUSCULAR | Status: DC | PRN
  Administered 2021-12-03: 2 mg via INTRAVENOUS

## 2021-12-03 MED ORDER — METHOCARBAMOL 1000 MG/10ML IJ SOLN
500.0000 mg | Freq: Four times a day (QID) | INTRAVENOUS | Status: DC | PRN
  Filled 2021-12-03: qty 5

## 2021-12-03 MED ORDER — ONDANSETRON HCL 4 MG PO TABS: 4.0000 mg | ORAL_TABLET | Freq: Four times a day (QID) | ORAL | Status: DC | PRN

## 2021-12-03 MED ORDER — LACTATED RINGERS IV SOLN: INTRAVENOUS | Status: DC

## 2021-12-03 MED ORDER — SERTRALINE HCL 50 MG PO TABS: 50.0000 mg | ORAL_TABLET | ORAL | Status: DC

## 2021-12-03 MED ORDER — METFORMIN HCL ER 500 MG PO TB24
1000.0000 mg | ORAL_TABLET | Freq: Every day | ORAL | Status: DC
  Administered 2021-12-04: 1000 mg via ORAL
  Filled 2021-12-03: qty 2

## 2021-12-03 MED ORDER — FELODIPINE ER 5 MG PO TB24
5.0000 mg | ORAL_TABLET | Freq: Every day | ORAL | Status: DC
  Administered 2021-12-04: 5 mg via ORAL
  Filled 2021-12-03: qty 1

## 2021-12-03 MED ORDER — FENTANYL CITRATE (PF) 100 MCG/2ML IJ SOLN
INTRAMUSCULAR | Status: AC
  Filled 2021-12-03: qty 2

## 2021-12-03 MED ORDER — FENTANYL CITRATE PF 50 MCG/ML IJ SOSY
PREFILLED_SYRINGE | INTRAMUSCULAR | Status: AC
  Filled 2021-12-03: qty 2

## 2021-12-03 MED ORDER — METOCLOPRAMIDE HCL 5 MG PO TABS: 5.0000 mg | ORAL_TABLET | Freq: Three times a day (TID) | ORAL | Status: DC | PRN

## 2021-12-03 MED ORDER — METOCLOPRAMIDE HCL 5 MG/ML IJ SOLN: 5.0000 mg | Freq: Three times a day (TID) | INTRAMUSCULAR | Status: DC | PRN

## 2021-12-03 MED ORDER — ASPIRIN 81 MG PO CHEW
81.0000 mg | CHEWABLE_TABLET | Freq: Two times a day (BID) | ORAL | Status: DC
  Administered 2021-12-03 – 2021-12-04 (×2): 81 mg via ORAL
  Filled 2021-12-03 (×2): qty 1

## 2021-12-03 MED ORDER — POVIDONE-IODINE 10 % EX SWAB
2.0000 "application " | Freq: Once | CUTANEOUS | Status: AC
  Administered 2021-12-03: 2 via TOPICAL

## 2021-12-03 MED ORDER — ONDANSETRON HCL 4 MG/2ML IJ SOLN: 4.0000 mg | Freq: Four times a day (QID) | INTRAMUSCULAR | Status: DC | PRN

## 2021-12-03 MED ORDER — CELECOXIB 200 MG PO CAPS
200.0000 mg | ORAL_CAPSULE | Freq: Two times a day (BID) | ORAL | Status: DC
  Administered 2021-12-03 – 2021-12-04 (×2): 200 mg via ORAL
  Filled 2021-12-03 (×2): qty 1

## 2021-12-03 MED ORDER — PROPOFOL 500 MG/50ML IV EMUL
INTRAVENOUS | Status: DC | PRN
  Administered 2021-12-03: 20 ug/kg/min via INTRAVENOUS

## 2021-12-03 MED ORDER — CEFAZOLIN SODIUM-DEXTROSE 2-4 GM/100ML-% IV SOLN
2.0000 g | INTRAVENOUS | Status: AC
  Administered 2021-12-03: 2 g via INTRAVENOUS
  Filled 2021-12-03: qty 100

## 2021-12-03 MED ORDER — LISINOPRIL-HYDROCHLOROTHIAZIDE 10-12.5 MG PO TABS: 1.0000 | ORAL_TABLET | Freq: Every day | ORAL | Status: DC

## 2021-12-03 MED ORDER — BISACODYL 10 MG RE SUPP: 10.0000 mg | Freq: Every day | RECTAL | Status: DC | PRN

## 2021-12-03 MED ORDER — FERROUS SULFATE 325 (65 FE) MG PO TABS
325.0000 mg | ORAL_TABLET | Freq: Three times a day (TID) | ORAL | Status: DC
  Administered 2021-12-04: 325 mg via ORAL
  Filled 2021-12-03: qty 1

## 2021-12-03 MED ORDER — CHLORHEXIDINE GLUCONATE 0.12 % MT SOLN
15.0000 mL | Freq: Once | OROMUCOSAL | Status: AC
  Administered 2021-12-03: 15 mL via OROMUCOSAL

## 2021-12-03 MED ORDER — STERILE WATER FOR IRRIGATION IR SOLN
Status: DC | PRN
Start: 2021-12-03 — End: 2021-12-03
  Administered 2021-12-03: 2000 mL

## 2021-12-03 MED ORDER — HYDROCHLOROTHIAZIDE 12.5 MG PO TABS
12.5000 mg | ORAL_TABLET | Freq: Every day | ORAL | Status: DC
  Administered 2021-12-04: 12.5 mg via ORAL
  Filled 2021-12-03: qty 1

## 2021-12-03 MED ORDER — SODIUM CHLORIDE 0.9 % IR SOLN
Status: DC | PRN
  Administered 2021-12-03: 1000 mL

## 2021-12-03 MED ORDER — ACETAMINOPHEN 500 MG PO TABS
ORAL_TABLET | ORAL | Status: AC
  Administered 2021-12-03: 1000 mg
  Filled 2021-12-03: qty 2

## 2021-12-03 MED ORDER — DOCUSATE SODIUM 100 MG PO CAPS
100.0000 mg | ORAL_CAPSULE | Freq: Two times a day (BID) | ORAL | Status: DC
  Administered 2021-12-03 – 2021-12-04 (×2): 100 mg via ORAL
  Filled 2021-12-03 (×2): qty 1

## 2021-12-03 MED ORDER — HYDROCODONE-ACETAMINOPHEN 5-325 MG PO TABS
1.0000 | ORAL_TABLET | ORAL | Status: DC | PRN
  Administered 2021-12-03: 1 via ORAL
  Filled 2021-12-03: qty 1

## 2021-12-03 MED ORDER — FENTANYL CITRATE PF 50 MCG/ML IJ SOSY
25.0000 ug | PREFILLED_SYRINGE | INTRAMUSCULAR | Status: DC | PRN
  Administered 2021-12-03 (×3): 50 ug via INTRAVENOUS

## 2021-12-03 MED ORDER — ROCURONIUM BROMIDE 10 MG/ML (PF) SYRINGE
PREFILLED_SYRINGE | INTRAVENOUS | Status: DC | PRN
  Administered 2021-12-03: 50 mg via INTRAVENOUS

## 2021-12-03 MED ORDER — LISINOPRIL 10 MG PO TABS
10.0000 mg | ORAL_TABLET | Freq: Every day | ORAL | Status: DC
  Administered 2021-12-04: 10 mg via ORAL
  Filled 2021-12-03: qty 1

## 2021-12-03 MED ORDER — PROPOFOL 10 MG/ML IV BOLUS
INTRAVENOUS | Status: DC | PRN
  Administered 2021-12-03: 100 mg via INTRAVENOUS
  Administered 2021-12-03: 20 mg via INTRAVENOUS

## 2021-12-03 MED ORDER — PANTOPRAZOLE SODIUM 40 MG PO TBEC
40.0000 mg | DELAYED_RELEASE_TABLET | Freq: Every day | ORAL | Status: DC
  Administered 2021-12-04: 40 mg via ORAL
  Filled 2021-12-03: qty 1

## 2021-12-03 MED ORDER — SIMVASTATIN 20 MG PO TABS: 20.0000 mg | ORAL_TABLET | Freq: Every morning | ORAL | Status: DC

## 2021-12-03 MED ORDER — MENTHOL 3 MG MT LOZG: 1.0000 | LOZENGE | OROMUCOSAL | Status: DC | PRN

## 2021-12-03 MED ORDER — MIDAZOLAM HCL 2 MG/2ML IJ SOLN
INTRAMUSCULAR | Status: AC
  Filled 2021-12-03: qty 2

## 2021-12-03 MED ORDER — ACETAMINOPHEN 325 MG PO TABS: 325.0000 mg | ORAL_TABLET | Freq: Four times a day (QID) | ORAL | Status: DC | PRN

## 2021-12-03 MED ORDER — ONDANSETRON HCL 4 MG/2ML IJ SOLN
INTRAMUSCULAR | Status: DC | PRN
  Administered 2021-12-03: 4 mg via INTRAVENOUS

## 2021-12-03 MED ORDER — DEXAMETHASONE SODIUM PHOSPHATE 10 MG/ML IJ SOLN
10.0000 mg | Freq: Once | INTRAMUSCULAR | Status: AC
  Administered 2021-12-04: 10 mg via INTRAVENOUS
  Filled 2021-12-03: qty 1

## 2021-12-03 MED ORDER — METHOCARBAMOL 500 MG PO TABS
500.0000 mg | ORAL_TABLET | Freq: Four times a day (QID) | ORAL | Status: DC | PRN
  Administered 2021-12-03: 500 mg via ORAL
  Filled 2021-12-03: qty 1

## 2021-12-03 MED ORDER — PHENOL 1.4 % MT LIQD: 1.0000 | OROMUCOSAL | Status: DC | PRN

## 2021-12-03 MED ORDER — TRANEXAMIC ACID-NACL 1000-0.7 MG/100ML-% IV SOLN
1000.0000 mg | INTRAVENOUS | Status: AC
  Administered 2021-12-03: 1000 mg via INTRAVENOUS
  Filled 2021-12-03: qty 100

## 2021-12-03 MED ORDER — HYDROCODONE-ACETAMINOPHEN 7.5-325 MG PO TABS
1.0000 | ORAL_TABLET | ORAL | Status: DC | PRN
  Administered 2021-12-04 (×2): 1 via ORAL
  Administered 2021-12-04: 2 via ORAL
  Filled 2021-12-03: qty 1
  Filled 2021-12-03: qty 2
  Filled 2021-12-03: qty 1

## 2021-12-03 MED ORDER — MORPHINE SULFATE (PF) 2 MG/ML IV SOLN: 0.5000 mg | INTRAVENOUS | Status: DC | PRN

## 2021-12-03 MED ORDER — DIPHENHYDRAMINE HCL 12.5 MG/5ML PO ELIX: 12.5000 mg | ORAL_SOLUTION | ORAL | Status: DC | PRN

## 2021-12-03 MED ORDER — FENTANYL CITRATE PF 50 MCG/ML IJ SOSY
PREFILLED_SYRINGE | INTRAMUSCULAR | Status: AC
  Filled 2021-12-03: qty 1

## 2021-12-03 MED ORDER — ORAL CARE MOUTH RINSE: 15.0000 mL | Freq: Once | OROMUCOSAL | Status: AC

## 2021-12-03 MED ORDER — POLYETHYLENE GLYCOL 3350 17 G PO PACK: 17.0000 g | PACK | Freq: Every day | ORAL | Status: DC | PRN

## 2021-12-03 MED ORDER — HYDROMORPHONE HCL 1 MG/ML IJ SOLN
0.2500 mg | INTRAMUSCULAR | Status: DC | PRN
  Administered 2021-12-03 (×2): 0.5 mg via INTRAVENOUS

## 2021-12-03 MED ORDER — SUGAMMADEX SODIUM 200 MG/2ML IV SOLN
INTRAVENOUS | Status: DC | PRN
  Administered 2021-12-03: 200 mg via INTRAVENOUS

## 2021-12-03 MED ORDER — SODIUM CHLORIDE 0.9 % IV SOLN: INTRAVENOUS | Status: DC

## 2021-12-03 MED ORDER — PHENYLEPHRINE HCL-NACL 20-0.9 MG/250ML-% IV SOLN
INTRAVENOUS | Status: DC | PRN
  Administered 2021-12-03: 30 ug/min via INTRAVENOUS

## 2021-12-03 MED ORDER — LACTATED RINGERS IV SOLN
INTRAVENOUS | Status: DC
Start: 2021-12-03 — End: 2021-12-03

## 2021-12-03 SURGICAL SUPPLY — 41 items
BAG COUNTER SPONGE SURGICOUNT (BAG) IMPLANT
BAG DECANTER FOR FLEXI CONT (MISCELLANEOUS) IMPLANT
BAG ZIPLOCK 12X15 (MISCELLANEOUS) IMPLANT
BLADE SAG 18X100X1.27 (BLADE) ×2 IMPLANT
COVER PERINEAL POST (MISCELLANEOUS) ×2 IMPLANT
COVER SURGICAL LIGHT HANDLE (MISCELLANEOUS) ×2 IMPLANT
CUP ACET PINNACLE SECTR 58MM (Hips) IMPLANT
DERMABOND ADVANCED (GAUZE/BANDAGES/DRESSINGS) ×1
DERMABOND ADVANCED .7 DNX12 (GAUZE/BANDAGES/DRESSINGS) ×1 IMPLANT
DRAPE FOOT SWITCH (DRAPES) ×2 IMPLANT
DRAPE STERI IOBAN 125X83 (DRAPES) ×2 IMPLANT
DRAPE U-SHAPE 47X51 STRL (DRAPES) ×4 IMPLANT
DRESSING AQUACEL AG SP 3.5X10 (GAUZE/BANDAGES/DRESSINGS) ×1 IMPLANT
DRSG AQUACEL AG SP 3.5X10 (GAUZE/BANDAGES/DRESSINGS) ×2
DURAPREP 26ML APPLICATOR (WOUND CARE) ×2 IMPLANT
ELECT REM PT RETURN 15FT ADLT (MISCELLANEOUS) ×2 IMPLANT
ELIMINATOR HOLE APEX DEPUY (Hips) ×1 IMPLANT
GAUZE 4X4 16PLY ~~LOC~~+RFID DBL (SPONGE) IMPLANT
GLOVE SURG ENC MOIS LTX SZ6 (GLOVE) ×2 IMPLANT
GLOVE SURG ENC MOIS LTX SZ7 (GLOVE) ×2 IMPLANT
GLOVE SURG UNDER LTX SZ6.5 (GLOVE) ×2 IMPLANT
GLOVE SURG UNDER POLY LF SZ7.5 (GLOVE) ×2 IMPLANT
GOWN STRL REUS W/TWL LRG LVL3 (GOWN DISPOSABLE) ×4 IMPLANT
HEAD CERAMIC 36 PLUS5 (Hips) ×1 IMPLANT
HOLDER FOLEY CATH W/STRAP (MISCELLANEOUS) ×2 IMPLANT
KIT TURNOVER KIT A (KITS) IMPLANT
LINER NEUTRAL 36X58 PLUS4 ×1 IMPLANT
PACK ANTERIOR HIP CUSTOM (KITS) ×2 IMPLANT
PINNACLE SECTOR CUP 58MM (Hips) ×2 IMPLANT
SCREW 6.5MMX25MM (Screw) ×1 IMPLANT
SPONGE T-LAP 18X18 ~~LOC~~+RFID (SPONGE) ×6 IMPLANT
STEM FEMORAL SZ6 HIGH ACTIS (Stem) ×1 IMPLANT
SUT MNCRL AB 4-0 PS2 18 (SUTURE) ×2 IMPLANT
SUT STRATAFIX 0 PDS 27 VIOLET (SUTURE) ×2
SUT VIC AB 1 CT1 36 (SUTURE) ×6 IMPLANT
SUT VIC AB 2-0 CT1 27 (SUTURE) ×2
SUT VIC AB 2-0 CT1 TAPERPNT 27 (SUTURE) ×2 IMPLANT
SUTURE STRATFX 0 PDS 27 VIOLET (SUTURE) ×1 IMPLANT
TRAY FOLEY MTR SLVR 16FR STAT (SET/KITS/TRAYS/PACK) IMPLANT
TUBE SUCTION HIGH CAP CLEAR NV (SUCTIONS) ×2 IMPLANT
WATER STERILE IRR 1000ML POUR (IV SOLUTION) ×2 IMPLANT

## 2021-12-03 NOTE — H&P (Signed)
TOTAL HIP ADMISSION H&P  Patient is admitted for left total hip arthroplasty.  Subjective:  Chief Complaint: left hip pain  HPI: Jimmy Frazier, 71 y.o. male, has a history of pain and functional disability in the left hip(s) due to arthritis and patient has failed non-surgical conservative treatments for greater than 12 weeks to include NSAID's and/or analgesics and activity modification.  Onset of symptoms was gradual starting 2 years ago with gradually worsening course since that time.The patient noted no past surgery on the left hip(s).  Patient currently rates pain in the left hip at 7 out of 10 with activity. Patient has worsening of pain with activity and weight bearing, pain that interfers with activities of daily living, and pain with passive range of motion. Patient has evidence of joint space narrowing by imaging studies. This condition presents safety issues increasing the risk of falls.  There is no current active infection.  Patient Active Problem List   Diagnosis Date Noted   S/P right THA, AA 10/04/2019   S/P left TKA 03/25/2016   Obese 02/13/2016   S/P right TKA 02/12/2016   Past Medical History:  Diagnosis Date   Arthritis    osteoarthritis   Back pain    occasionally   Diabetes mellitus without complication (HCC)    borderline;but no meds required yet;diet and exercise   Diverticulosis    GERD (gastroesophageal reflux disease)    takes Omeprazole daily prn   Headache(784.0)    hx of cluster HA;but none in a while,02-05-16 no longer a problem   Hyperlipidemia    but doesn't require meds   Hypertension    takes Prinizide and Felodipine daily   Joint pain    Joint swelling    knees only   Muscle spasm    Robaxin daily prn    Past Surgical History:  Procedure Laterality Date   COLONOSCOPY     ESOPHAGOGASTRODUODENOSCOPY     KNEE ARTHROSCOPY     2 on right and 1 on left   right shoulder arthroscopy     TOTAL HIP ARTHROPLASTY Right 10/04/2019   Procedure:  TOTAL HIP ARTHROPLASTY ANTERIOR APPROACH;  Surgeon: Paralee Cancel, MD;  Location: [facility];  Service: Orthopedics;  Laterality: Right;  23mins   TOTAL KNEE ARTHROPLASTY Right 02/12/2016   Procedure: TOTAL RIGHT KNEE ARTHROPLASTY,INTRARTICULAR INJECTION LEFT KNEE;  Surgeon: Paralee Cancel, MD;  Location: [facility];  Service: Orthopedics;  Laterality: Right;   TOTAL KNEE ARTHROPLASTY Left 03/25/2016   Procedure: LEFT TOTAL KNEE ARTHROPLASTY;  Surgeon: Paralee Cancel, MD;  Location: [facility];  Service: Orthopedics;  Laterality: Left;   TOTAL SHOULDER ARTHROPLASTY Left 02/25/2013   Procedure: LEFT TOTAL SHOULDER ARTHROPLASTY ;  Surgeon: Augustin Schooling, MD;  Location: [facility];  Service: Orthopedics;  Laterality: Left;    No current facility-administered medications for this encounter.   Current Outpatient Medications  Medication Sig Dispense Refill Last Dose   felodipine (PLENDIL) 5 MG 24 hr tablet Take 5 mg by mouth daily.      lisinopril-hydrochlorothiazide (ZESTORETIC) 10-12.5 MG tablet Take 1 tablet by mouth daily.      metFORMIN (GLUCOPHAGE-XR) 500 MG 24 hr tablet Take 1,000 mg by mouth daily.       omeprazole (PRILOSEC) 40 MG capsule Take 40 mg by mouth daily as needed (For heartburn or acid reflux.).      sertraline (ZOLOFT) 50 MG tablet Take 50 mg by mouth 2 (two) times a week.      simvastatin (ZOCOR)  20 MG tablet Take 20 mg by mouth every morning.      docusate sodium (COLACE) 100 MG capsule Take 1 capsule (100 mg total) by mouth 2 (two) times daily. (Patient not taking: Reported on 11/13/2021) 28 capsule 0 Not Taking   ferrous sulfate (FERROUSUL) 325 (65 FE) MG tablet Take 1 tablet (325 mg total) by mouth 3 (three) times daily with meals for 14 days. (Patient not taking: Reported on 11/13/2021) 42 tablet 0 Not Taking   HYDROcodone-acetaminophen (NORCO) 7.5-325 MG tablet Take 1-2 tablets by mouth every 4 (four) hours as needed for moderate pain. (Patient not taking: Reported on 11/13/2021) 60 tablet 0 Not  Taking   methocarbamol (ROBAXIN) 500 MG tablet Take 1 tablet (500 mg total) by mouth every 6 (six) hours as needed for muscle spasms. (Patient not taking: Reported on 11/13/2021) 40 tablet 0 Not Taking   polyethylene glycol (MIRALAX / GLYCOLAX) 17 g packet Take 17 g by mouth 2 (two) times daily. (Patient not taking: Reported on 11/13/2021) 28 packet 0 Not Taking   No Known Allergies  Social History   Tobacco Use   Smoking status: Never   Smokeless tobacco: Never  Substance Use Topics   Alcohol use: Yes    Comment: rarely    History reviewed. No pertinent family history.   Review of Systems  Constitutional:  Negative for chills and fever.  Respiratory:  Negative for cough and shortness of breath.   Cardiovascular:  Negative for chest pain.  Gastrointestinal:  Negative for nausea and vomiting.  Musculoskeletal:  Positive for arthralgias.    Objective:  Physical Exam Well nourished and well developed. General: Alert and oriented x3, cooperative and pleasant, no acute distress. Head: normocephalic, atraumatic, neck supple. Eyes: EOMI.  Musculoskeletal: Left hip exam: Painful and limited hip flexion internal rotation to just 5 degrees with pelvic tilting and external rotation only to 20 degrees Limited active hip flexion and abduction Neurovascular intact distally  Calves soft and nontender. Motor function intact in LE. Strength 5/5 LE bilaterally. Neuro: Distal pulses 2+. Sensation to light touch intact in LE.  Vital signs in last 24 hours:    Labs:   Estimated body mass index is 29.67 kg/m as calculated from the following:   Height as of 11/20/21: 5\' 10"  (1.778 m).   Weight as of 11/20/21: 93.8 kg.   Imaging Review Plain radiographs demonstrate severe degenerative joint disease of the left hip(s). The bone quality appears to be adequate for age and reported activity level.      Assessment/Plan:  End stage arthritis, left hip(s)  The patient history, physical  examination, clinical judgement of the provider and imaging studies are consistent with end stage degenerative joint disease of the left hip(s) and total hip arthroplasty is deemed medically necessary. The treatment options including medical management, injection therapy, arthroscopy and arthroplasty were discussed at length. The risks and benefits of total hip arthroplasty were presented and reviewed. The risks due to aseptic loosening, infection, stiffness, dislocation/subluxation,  thromboembolic complications and other imponderables were discussed.  The patient acknowledged the explanation, agreed to proceed with the plan and consent was signed. Patient is being admitted for inpatient treatment for surgery, pain control, PT, OT, prophylactic antibiotics, VTE prophylaxis, progressive ambulation and ADL's and discharge planning.The patient is planning to be discharged  home.  Therapy Plans: HEP (wants to do OPPT) Disposition: Home with wife Planned DVT Prophylaxis: aspirin 81mg  BID DME needed: none PCP: Dr. Jeralene Huff, clearance received TXA: IV Allergies: NKDA  Anesthesia Concerns: none BMI: 30.7 Last HgbA1c: 6.5%   Other: - Norco, robaxin, tylenol, celebrex - Stiffness in right THA - cant cross his legs - wondering about post op PT  Costella Hatcher, PA-C Orthopedic Surgery [facility] [place] [phone]

## 2021-12-03 NOTE — Anesthesia Postprocedure Evaluation (Signed)
Anesthesia Post Note  Patient: Jimmy Frazier  Procedure(s) Performed: TOTAL HIP ARTHROPLASTY ANTERIOR APPROACH (Left: Hip)     Patient location during evaluation: PACU Anesthesia Type: General Level of consciousness: awake and alert Pain management: pain level controlled Vital Signs Assessment: post-procedure vital signs reviewed and stable Respiratory status: spontaneous breathing, nonlabored ventilation and respiratory function stable Cardiovascular status: blood pressure returned to baseline and stable Postop Assessment: no apparent nausea or vomiting Anesthetic complications: no   No notable events documented.  Last Vitals:  Vitals:   12/03/21 1745 12/03/21 1806  BP: (!) 133/91 136/90  Pulse: 81 81  Resp: 10 14  Temp:  36.9 C  SpO2: 100% 98%    Last Pain:  Vitals:   12/03/21 1806  TempSrc: Oral  PainSc:                  Michell Kader,W. EDMOND

## 2021-12-03 NOTE — Anesthesia Procedure Notes (Signed)
Procedure Name: Intubation Date/Time: 12/03/2021 2:54 PM Performed by: Cleda Daub, CRNA Pre-anesthesia Checklist: Patient identified, Emergency Drugs available, Suction available and Patient being monitored Patient Re-evaluated:Patient Re-evaluated prior to induction Oxygen Delivery Method: Circle system utilized Preoxygenation: Pre-oxygenation with 100% oxygen Induction Type: IV induction Ventilation: Mask ventilation without difficulty Laryngoscope Size: Mac and 4 Grade View: Grade I Tube type: Oral Tube size: 7.5 mm Number of attempts: 1 Airway Equipment and Method: Stylet and Oral airway Placement Confirmation: ETT inserted through vocal cords under direct vision, positive ETCO2 and breath sounds checked- equal and bilateral Secured at: 23 cm Tube secured with: Tape Dental Injury: Teeth and Oropharynx as per pre-operative assessment

## 2021-12-03 NOTE — Op Note (Signed)
NAME:  Jimmy Frazier                ACCOUNT NO.: [id number]      MEDICAL RECORD NO.: [id number]      FACILITY:  [facility]      PHYSICIAN:  Mauri Pole  DATE OF BIRTH:  1950/12/17     DATE OF PROCEDURE:  12/03/2021                                 OPERATIVE REPORT         PREOPERATIVE DIAGNOSIS: Left  hip osteoarthritis.      POSTOPERATIVE DIAGNOSIS:  Left hip osteoarthritis.      PROCEDURE:  Left total hip replacement through an anterior approach   utilizing DePuy THR system, component size 56 mm pinnacle cup, a size 36+4 neutral   Altrex liner, a size 6 Hi Actis stem with a 36+5 delta ceramic   ball.      SURGEON:  Pietro Cassis. Alvan Dame, M.D.      ASSISTANT:  Costella Hatcher, PA-C     ANESTHESIA:  Spinal.      SPECIMENS:  None.      COMPLICATIONS:  None.      BLOOD LOSS:  400 cc     DRAINS:  None.      INDICATION OF THE PROCEDURE:  Jimmy Frazier is a 71 y.o. male who had   presented to office for evaluation of left hip pain.  Radiographs revealed   progressive degenerative changes with bone-on-bone   articulation of the  hip joint, including subchondral cystic changes and osteophytes.  The patient had painful limited range of   motion significantly affecting their overall quality of life and function.  The patient was failing to    respond to conservative measures including medications and/or injections and activity modification and at this point was ready   to proceed with more definitive measures.  Consent was obtained for   benefit of pain relief.  Specific risks of infection, DVT, component   failure, dislocation, neurovascular injury, and need for revision surgery were reviewed in the office as well discussion of   the anterior versus posterior approach were reviewed.     PROCEDURE IN DETAIL:  The patient was brought to operative theater.   Once adequate anesthesia, preoperative antibiotics, 2 gm of Ancef, 1 gm of Tranexamic Acid, and 10 mg of  Decadron were administered, the patient was positioned supine on the [employer] table.  Once the patient was safely positioned with adequate padding of boney prominences we predraped out the hip, and used fluoroscopy to confirm orientation of the pelvis.      The left hip was then prepped and draped from proximal iliac crest to   mid thigh with a shower curtain technique.      Time-out was performed identifying the patient, planned procedure, and the appropriate extremity.     An incision was then made 2 cm lateral to the   anterior superior iliac spine extending over the orientation of the   tensor fascia lata muscle and sharp dissection was carried down to the   fascia of the muscle.      The fascia was then incised.  The muscle belly was identified and swept   laterally and retractor placed along the superior neck.  Following   cauterization of the circumflex vessels and removing some  pericapsular   fat, a second cobra retractor was placed on the inferior neck.  A T-capsulotomy was made along the line of the   superior neck to the trochanteric fossa, then extended proximally and   distally.  Tag sutures were placed and the retractors were then placed   intracapsular.  We then identified the trochanteric fossa and   orientation of my neck cut and then made a neck osteotomy with the femur on traction.  The femoral   head was removed without difficulty or complication.  Traction was let   off and retractors were placed posterior and anterior around the   acetabulum.      The labrum and foveal tissue were debrided.  I began reaming with a 47 mm   reamer and reamed up to 55 mm reamer with good bony bed preparation and a 56 mm  cup was chosen.  The final 56 mm Pinnacle cup was then impacted under fluoroscopy to confirm the depth of penetration and orientation with respect to   Abduction and forward flexion.  A screw was placed into the ilium followed by the hole eliminator.  The final   36+4  neutral Altrex liner was impacted with good visualized rim fit.  The cup was positioned anatomically within the acetabular portion of the pelvis.      At this point, the femur was rolled to 100 degrees.  Further capsule was   released off the inferior aspect of the femoral neck.  I then   released the superior capsule proximally.  With the leg in a neutral position the hook was placed laterally   along the femur under the vastus lateralis origin and elevated manually and then held in position using the hook attachment on the bed.  The leg was then extended and adducted with the leg rolled to 100   degrees of external rotation.  Retractors were placed along the medial calcar and posteriorly over the greater trochanter.  Once the proximal femur was fully   exposed, I used a box osteotome to set orientation.  I then began   broaching with the starting chili pepper broach and passed this by hand and then broached up to 6.  With the 6 broach in place I chose a high offset neck and did several trial reductions.  The offset was appropriate, leg lengths   appeared to be equal best matched with the +5 versus the +1.5 head ball trial confirmed radiographically.   Given these findings, I went ahead and dislocated the hip, repositioned all   retractors and positioned the right hip in the extended and abducted position.  The final 6 Hi Actis stem was   chosen and it was impacted down to the level of neck cut.  Based on this   and the trial reductions, a final 36+5 delta ceramic ball was chosen and   impacted onto a clean and dry trunnion, and the hip was reduced.  The   hip had been irrigated throughout the case again at this point.  I did   reapproximate the superior capsular leaflet to the anterior leaflet   using #1 Vicryl.  The fascia of the   tensor fascia lata muscle was then reapproximated using #1 Vicryl and #0 Stratafix sutures.  The   remaining wound was closed with 2-0 Vicryl and running 4-0  Monocryl.   The hip was cleaned, dried, and dressed sterilely using Dermabond and   Aquacel dressing.  The patient was then brought  to recovery room in stable condition tolerating the procedure well.    Costella Hatcher, PA-C was present for the entirety of the case involved from   preoperative positioning, perioperative retractor management, general   facilitation of the case, as well as primary wound closure as assistant.            Pietro Cassis Alvan Dame, M.D.        12/03/2021 2:36 PM

## 2021-12-03 NOTE — Anesthesia Preprocedure Evaluation (Signed)
Anesthesia Evaluation  Patient identified by MRN, date of birth, ID band Patient awake    Reviewed: Allergy & Precautions, H&P , NPO status , Patient's Chart, lab work & pertinent test results  Airway Mallampati: II  TM Distance: >3 FB Neck ROM: Full    Dental no notable dental hx. (+) Teeth Intact, Dental Advisory Given   Pulmonary neg pulmonary ROS,    Pulmonary exam normal breath sounds clear to auscultation       Cardiovascular hypertension, Pt. on medications  Rhythm:Regular Rate:Normal     Neuro/Psych  Headaches, negative psych ROS   GI/Hepatic Neg liver ROS, GERD  Medicated,  Endo/Other  diabetes, Type 2, Oral Hypoglycemic Agents  Renal/GU negative Renal ROS  negative genitourinary   Musculoskeletal  (+) Arthritis , Osteoarthritis,    Abdominal   Peds  Hematology negative hematology ROS (+)   Anesthesia Other Findings   Reproductive/Obstetrics negative OB ROS                             Anesthesia Physical Anesthesia Plan  ASA: 2  Anesthesia Plan: Spinal   Post-op Pain Management: Tylenol PO (pre-op)   Induction: Intravenous  PONV Risk Score and Plan: 2 and Propofol infusion, Ondansetron and Dexamethasone  Airway Management Planned: Natural Airway and Simple Face Mask  Additional Equipment:   Intra-op Plan:   Post-operative Plan:   Informed Consent: I have reviewed the patients History and Physical, chart, labs and discussed the procedure including the risks, benefits and alternatives for the proposed anesthesia with the patient or authorized representative who has indicated his/her understanding and acceptance.     Dental advisory given  Plan Discussed with: CRNA  Anesthesia Plan Comments:         Anesthesia Quick Evaluation

## 2021-12-03 NOTE — Plan of Care (Signed)
  Problem: Education: Goal: Knowledge of General Education information will improve Description Including pain rating scale, medication(s)/side effects and non-pharmacologic comfort measures Outcome: Progressing   

## 2021-12-03 NOTE — Transfer of Care (Signed)
Immediate Anesthesia Transfer of Care Note  Patient: Jimmy Frazier  Procedure(s) Performed: TOTAL HIP ARTHROPLASTY ANTERIOR APPROACH (Left: Hip)  Patient Location: PACU  Anesthesia Type:General  Level of Consciousness: awake, alert , oriented and patient cooperative  Airway & Oxygen Therapy: Patient Spontanous Breathing  Post-op Assessment: Report given to RN and Post -op Vital signs reviewed and stable  Post vital signs: Reviewed and stable  Last Vitals:  Vitals Value Taken Time  BP 127/84 12/03/21 1632  Temp    Pulse 76 12/03/21 1634  Resp 17 12/03/21 1634  SpO2 98 % 12/03/21 1634  Vitals shown include unvalidated device data.  Last Pain:  Vitals:   12/03/21 1208  TempSrc:   PainSc: 5       Patients Stated Pain Goal: 4 (28/36/62 9476)  Complications: No notable events documented.

## 2021-12-03 NOTE — Discharge Instructions (Signed)

## 2021-12-03 NOTE — Anesthesia Procedure Notes (Signed)
Spinal  Patient location during procedure: OR Start time: 12/03/2021 2:35 PM End time: 12/03/2021 2:45 PM Reason for block: surgical anesthesia Staffing Performed: anesthesiologist  Anesthesiologist: Roderic Palau, MD Preanesthetic Checklist Completed: patient identified, IV checked, risks and benefits discussed, surgical consent, monitors and equipment checked, pre-op evaluation and timeout performed Spinal Block Patient position: sitting Prep: DuraPrep Patient monitoring: cardiac monitor, continuous pulse ox and blood pressure Approach: midline (R paramedian attempted as well.) Location: L3-4 (Attempted L2-3) Injection technique: single-shot Needle Needle type: Quincke  Needle gauge: 22 G Needle length: 9 cm Assessment Events: failed spinal and second provider Additional Notes Functioning IV was confirmed and monitors were applied. Sterile prep and drape, including hand hygiene and sterile gloves were used. The patient was positioned and the spine was prepped. The skin was anesthetized with lidocaine. Attempts were made by both CRNA and MD. Unable to locate the space at L2-3 or L3-4 both midline and paramedian approaches. Procedure aborted and will proceed to [place].

## 2021-12-03 NOTE — Interval H&P Note (Signed)
History and Physical Interval Note:  12/03/2021 12:38 PM  Jimmy Frazier  has presented today for surgery, with the diagnosis of Left hip osteoarthritis.  The various methods of treatment have been discussed with the patient and family. After consideration of risks, benefits and other options for treatment, the patient has consented to  Procedure(s): TOTAL HIP ARTHROPLASTY ANTERIOR APPROACH (Left) as a surgical intervention.  The patient's history has been reviewed, patient examined, no change in status, stable for surgery.  I have reviewed the patient's chart and labs.  Questions were answered to the patient's satisfaction.     Mauri Pole

## 2021-12-04 ENCOUNTER — Encounter (HOSPITAL_COMMUNITY): Payer: Self-pay | Admitting: Orthopedic Surgery

## 2021-12-04 DIAGNOSIS — M1612 Unilateral primary osteoarthritis, left hip: Secondary | ICD-10-CM | POA: Diagnosis not present

## 2021-12-04 LAB — CBC
HCT: 37 % — ABNORMAL LOW (ref 39.0–52.0)
Hemoglobin: 12.3 g/dL — ABNORMAL LOW (ref 13.0–17.0)
MCH: 28.4 pg (ref 26.0–34.0)
MCHC: 33.2 g/dL (ref 30.0–36.0)
MCV: 85.5 fL (ref 80.0–100.0)
Platelets: 248 10*3/uL (ref 150–400)
RBC: 4.33 MIL/uL (ref 4.22–5.81)
RDW: 13.7 % (ref 11.5–15.5)
WBC: 10.3 10*3/uL (ref 4.0–10.5)
nRBC: 0 % (ref 0.0–0.2)

## 2021-12-04 LAB — BASIC METABOLIC PANEL
Anion gap: 8 (ref 5–15)
BUN: 21 mg/dL (ref 8–23)
CO2: 22 mmol/L (ref 22–32)
Calcium: 8.4 mg/dL — ABNORMAL LOW (ref 8.9–10.3)
Chloride: 104 mmol/L (ref 98–111)
Creatinine, Ser: 1.49 mg/dL — ABNORMAL HIGH (ref 0.61–1.24)
GFR, Estimated: 51 mL/min — ABNORMAL LOW (ref >60)
Glucose, Bld: 181 mg/dL — ABNORMAL HIGH (ref 70–99)
Potassium: 4.2 mmol/L (ref 3.5–5.1)
Sodium: 134 mmol/L — ABNORMAL LOW (ref 135–145)

## 2021-12-04 LAB — GLUCOSE, CAPILLARY
Glucose-Capillary: 147 mg/dL — ABNORMAL HIGH (ref 70–99)
Glucose-Capillary: 174 mg/dL — ABNORMAL HIGH (ref 70–99)

## 2021-12-04 MED ORDER — HYDROCODONE-ACETAMINOPHEN 5-325 MG PO TABS: 1.0000 | ORAL_TABLET | Freq: Four times a day (QID) | ORAL | 0 refills | Status: AC | PRN

## 2021-12-04 MED ORDER — ASPIRIN 81 MG PO CHEW: 81.0000 mg | CHEWABLE_TABLET | Freq: Two times a day (BID) | ORAL | 0 refills | Status: AC

## 2021-12-04 MED ORDER — METHOCARBAMOL 500 MG PO TABS: 500.0000 mg | ORAL_TABLET | Freq: Four times a day (QID) | ORAL | 0 refills | Status: AC | PRN

## 2021-12-04 NOTE — Progress Notes (Signed)
° °  Subjective: 1 Day Post-Op Procedure(s) (LRB): TOTAL HIP ARTHROPLASTY ANTERIOR APPROACH (Left) Patient reports pain as mild.   Patient seen in rounds by Dr. Alvan Dame. Patient is resting in bed on exam this morning. No acute events overnight. Foley catheter removed. Has not been up with PT yet.  We will start therapy today.   Objective: Vital signs in last 24 hours: Temp:  [97.5 F (36.4 C)-99.3 F (37.4 C)] 98.3 F (36.8 C) (02/01 0646) Pulse Rate:  [76-98] 95 (02/01 0646) Resp:  [10-20] 18 (02/01 0646) BP: (116-141)/(82-91) 116/82 (02/01 0646) SpO2:  [98 %-100 %] 99 % (02/01 0646) Weight:  [93.8 kg] 93.8 kg (01/31 1208)  Intake/Output from previous day:  Intake/Output Summary (Last 24 hours) at 12/04/2021 0734 Last data filed at 12/04/2021 0310 Gross per 24 hour  Intake 1900 ml  Output 200 ml  Net 1700 ml     Intake/Output this shift: No intake/output data recorded.  Labs: Recent Labs    12/04/21 0333  HGB 12.3*   Recent Labs    12/04/21 0333  WBC 10.3  RBC 4.33  HCT 37.0*  PLT 248   Recent Labs    12/04/21 0333  NA 134*  K 4.2  CL 104  CO2 22  BUN 21  CREATININE 1.49*  GLUCOSE 181*  CALCIUM 8.4*   No results for input(s): LABPT, INR in the last 72 hours.  Exam: General - Patient is Alert and Oriented Extremity - Neurologically intact Sensation intact distally Intact pulses distally Dorsiflexion/Plantar flexion intact Dressing - dressing C/D/I Motor Function - intact, moving foot and toes well on exam.   Past Medical History:  Diagnosis Date   Arthritis    osteoarthritis   Back pain    occasionally   Diabetes mellitus without complication (HCC)    borderline;but no meds required yet;diet and exercise   Diverticulosis    GERD (gastroesophageal reflux disease)    takes Omeprazole daily prn   Headache(784.0)    hx of cluster HA;but none in a while,02-05-16 no longer a problem   Hyperlipidemia    but doesn't require meds   Hypertension     takes Prinizide and Felodipine daily   Joint pain    Joint swelling    knees only   Muscle spasm    Robaxin daily prn    Assessment/Plan: 1 Day Post-Op Procedure(s) (LRB): TOTAL HIP ARTHROPLASTY ANTERIOR APPROACH (Left) Principal Problem:   S/P total left hip arthroplasty  Estimated body mass index is 29.67 kg/m as calculated from the following:   Height as of this encounter: 5\' 10"  (1.778 m).   Weight as of this encounter: 93.8 kg. Advance diet Up with therapy D/C IV fluids  DVT Prophylaxis - Aspirin Weight bearing as tolerated.  Cr. Elevated at 1.49. Per chart review, this appears to be baseline.   Plan is to go Home after hospital stay. Plan for discharge home today after 1-2 sessions of therapy as long as he is meeting his goals. Follow up in 2 weeks.   Griffith Citron, PA-C Orthopedic Surgery [phone] 12/04/2021, 7:34 AM

## 2021-12-04 NOTE — Evaluation (Signed)
Physical Therapy Evaluation Patient Details Name: Jimmy Frazier MRN: [id number] DOB: 10/13/1951 Today's Date: 12/04/2021  History of Present Illness  Pt is a 71 year old male s/p L THA on 12/03/21.  PMHx: R THA, B TKA, L TSA, DM  Clinical Impression  Pt is s/p THA resulting in the deficits listed below (see PT Problem List).  Pt will benefit from skilled PT to increase their independence and safety with mobility to allow discharge to the venue listed below.  Pt ambulated in hallway and performing some standing exercises.  Pt to d/c home after second session.      Recommendations for follow up therapy are one component of a multi-disciplinary discharge planning process, led by the attending physician.  Recommendations may be updated based on patient status, additional functional criteria and insurance authorization.  Follow Up Recommendations Follow physician's recommendations for discharge plan and follow up therapies    Assistance Recommended at Discharge    Patient can return home with the following       Equipment Recommendations None recommended by PT  Recommendations for Other Services       Functional Status Assessment Patient has had a recent decline in their functional status and demonstrates the ability to make significant improvements in function in a reasonable and predictable amount of time.     Precautions / Restrictions Precautions Precautions: Fall Restrictions Weight Bearing Restrictions: No Other Position/Activity Restrictions: WBAT      Mobility  Bed Mobility Overal bed mobility: Needs Assistance Bed Mobility: Supine to Sit     Supine to sit: Supervision, HOB elevated     General bed mobility comments: cues for technique    Transfers Overall transfer level: Needs assistance Equipment used: Rolling walker (2 wheels) Transfers: Sit to/from Stand Sit to Stand: Min guard                Ambulation/Gait   Gait Distance (Feet): 120  Feet Assistive device: Rolling walker (2 wheels) Gait Pattern/deviations: Step-to pattern, Decreased stance time - left, Step-through pattern       General Gait Details: verbal cues for sequence, RW positioning, step length, posture  Stairs            Wheelchair Mobility    Modified Rankin (Stroke Patients Only)       Balance                                             Pertinent Vitals/Pain Pain Assessment Pain Assessment: 0-10 Pain Score: 7  Pain Location: Lt hip Pain Descriptors / Indicators: Aching, Tender, Sore Pain Intervention(s): Repositioned, Monitored during session    Home Living Family/patient expects to be discharged to:: Private residence Living Arrangements: Spouse/significant other Available Help at Discharge: Family Type of Home: House Home Access: Stairs to enter Entrance Stairs-Rails: None Entrance Stairs-Number of Steps: 1   Home Layout: Able to live on main level with bedroom/bathroom;Two level Home Equipment: Conservation officer, nature (2 wheels)      Prior Function Prior Level of Function : Independent/Modified Independent                     Hand Dominance        Extremity/Trunk Assessment        Lower Extremity Assessment Lower Extremity Assessment: LLE deficits/detail LLE Deficits / Details: anticipated post op hip weakness, grossly hip 2+/5,  pt able to perform ankle pumps, good quad contraction       Communication   Communication: No difficulties  Cognition Arousal/Alertness: Awake/alert Behavior During Therapy: WFL for tasks assessed/performed Overall Cognitive Status: Within Functional Limits for tasks assessed                                          General Comments      Exercises Total Joint Exercises Hip ABduction/ADduction: AROM, Standing, Left, 10 reps Long Arc Quad: AROM, Seated, Left, 10 reps Knee Flexion: AROM, Left, Standing, 10 reps Marching in Standing: AROM, Left,  Standing, 10 reps   Assessment/Plan    PT Assessment Patient needs continued PT services  PT Problem List Decreased strength;Decreased range of motion;Decreased mobility;Decreased knowledge of precautions;Pain;Decreased knowledge of use of DME       PT Treatment Interventions Stair training;Gait training;Balance training;DME instruction;Therapeutic exercise;Functional mobility training;Therapeutic activities;Patient/family education    PT Goals (Current goals can be found in the Care Plan section)  Acute Rehab PT Goals PT Goal Formulation: With patient Time For Goal Achievement: 12/07/21 Potential to Achieve Goals: Good    Frequency 7X/week     Co-evaluation               AM-PAC PT "6 Clicks" Mobility  Outcome Measure Help needed turning from your back to your side while in a flat bed without using bedrails?: A Little Help needed moving from lying on your back to sitting on the side of a flat bed without using bedrails?: A Little Help needed moving to and from a bed to a chair (including a wheelchair)?: A Little Help needed standing up from a chair using your arms (e.g., wheelchair or bedside chair)?: A Little Help needed to walk in hospital room?: A Little Help needed climbing 3-5 steps with a railing? : A Little 6 Click Score: 18    End of Session Equipment Utilized During Treatment: Gait belt Activity Tolerance: Patient tolerated treatment well Patient left: in chair;with call bell/phone within reach;with chair alarm set Nurse Communication: Mobility status PT Visit Diagnosis: Other abnormalities of gait and mobility (R26.89)    Time: 4196-2229 PT Time Calculation (min) (ACUTE ONLY): 18 min   Charges:   PT Evaluation $PT Eval Low Complexity: 1 Low        Kati PT, DPT Acute Rehabilitation Services Pager: [phone] Office: [place] 12/04/2021, 1:21 PM

## 2021-12-04 NOTE — Progress Notes (Signed)
Physical Therapy Treatment Patient Details Name: Jimmy Frazier MRN: [id number] DOB: 1951/08/03 Today's Date: 12/04/2021   History of Present Illness Pt is a 71 year old male s/p L THA on 12/03/21.  PMHx: R THA, B TKA, L TSA, DM    PT Comments    Pt ambulated in hallway and practiced step.  Pt also performed LE exercises.  Pt provided with HEP handout and had no further questions.  Pt feels ready for d/c home today.    Recommendations for follow up therapy are one component of a multi-disciplinary discharge planning process, led by the attending physician.  Recommendations may be updated based on patient status, additional functional criteria and insurance authorization.  Follow Up Recommendations  Follow physician's recommendations for discharge plan and follow up therapies     Assistance Recommended at Discharge    Patient can return home with the following     Equipment Recommendations  None recommended by PT    Recommendations for Other Services       Precautions / Restrictions Precautions Precautions: Fall Restrictions Weight Bearing Restrictions: No Other Position/Activity Restrictions: WBAT     Mobility  Bed Mobility Overal bed mobility: Needs Assistance Bed Mobility: Supine to Sit     Supine to sit: Supervision, HOB elevated     General bed mobility comments: pt in recliner    Transfers Overall transfer level: Needs assistance Equipment used: Rolling walker (2 wheels) Transfers: Sit to/from Stand Sit to Stand: Min guard                Ambulation/Gait Ambulation/Gait assistance: Min guard Gait Distance (Feet): 120 Feet Assistive device: Rolling walker (2 wheels) Gait Pattern/deviations: Decreased stance time - left, Step-through pattern       General Gait Details: verbal cues for sequence, RW positioning, step length, posture   Stairs Stairs: Yes Stairs assistance: Min guard Stair Management: Step to pattern, Forwards, With walker Number  of Stairs: 1 General stair comments: verbal cues for sequence and safety; pt performed twice and reports understanding   Wheelchair Mobility    Modified Rankin (Stroke Patients Only)       Balance                                            Cognition Arousal/Alertness: Awake/alert Behavior During Therapy: WFL for tasks assessed/performed Overall Cognitive Status: Within Functional Limits for tasks assessed                                          Exercises Total Joint Exercises Ankle Circles/Pumps: AROM, Both, 10 reps Quad Sets: AROM, Both, 10 reps Short Arc Quad: AROM, Left, 10 reps Heel Slides: AAROM, Left, 10 reps Hip ABduction/ADduction: AAROM, Left, 10 reps     General Comments        Pertinent Vitals/Pain Pain Assessment Pain Assessment: 0-10 Pain Score: 7  Pain Location: Lt hip Pain Descriptors / Indicators: Aching, Tender, Sore Pain Intervention(s): Repositioned, Monitored during session    Home Living Family/patient expects to be discharged to:: Private residence Living Arrangements: Spouse/significant other Available Help at Discharge: Family Type of Home: House Home Access: Stairs to enter Entrance Stairs-Rails: None Entrance Stairs-Number of Steps: 1   Home Layout: Able to live on main level with bedroom/bathroom;Two level  Home Equipment: Conservation officer, nature (2 wheels)      Prior Function            PT Goals (current goals can now be found in the care plan section) Acute Rehab PT Goals PT Goal Formulation: With patient Time For Goal Achievement: 12/07/21 Potential to Achieve Goals: Good Progress towards PT goals: Progressing toward goals    Frequency    7X/week      PT Plan Current plan remains appropriate    Co-evaluation              AM-PAC PT "6 Clicks" Mobility   Outcome Measure  Help needed turning from your back to your side while in a flat bed without using bedrails?: A Little Help  needed moving from lying on your back to sitting on the side of a flat bed without using bedrails?: A Little Help needed moving to and from a bed to a chair (including a wheelchair)?: A Little Help needed standing up from a chair using your arms (e.g., wheelchair or bedside chair)?: A Little Help needed to walk in hospital room?: A Little Help needed climbing 3-5 steps with a railing? : A Little 6 Click Score: 18    End of Session Equipment Utilized During Treatment: Gait belt Activity Tolerance: Patient tolerated treatment well Patient left: in chair;with call bell/phone within reach;with family/visitor present;with nursing/sitter in room Nurse Communication: Mobility status PT Visit Diagnosis: Other abnormalities of gait and mobility (R26.89)     Time: 1610-9604 PT Time Calculation (min) (ACUTE ONLY): 13 min  Charges:  $Therapeutic Exercise: 8-22 mins                    Jannette Spanner PT, DPT Acute Rehabilitation Services Pager: [phone] Office: [place] 12/04/2021, 1:32 PM

## 2021-12-04 NOTE — Progress Notes (Signed)
Discharge teaching and paperwork complete.  Questions answered.  IV removed.  Patient discharged via wheelchair with belongings and significant other at side.  Care relinquished.

## 2021-12-04 NOTE — TOC Transition Note (Signed)
Transition of Care [facility]) - CM/SW Discharge Note  Patient Details  Name: Jimmy Frazier MRN: [id number] Date of Birth: 11/09/1950  [facility] CM/SW Contact:  Sherie Don, LCSW Phone Number: 12/04/2021, 9:51 AM  Clinical Narrative: Patient is expected to discharge home after working with PT. CSW met with patient to confirm discharge plan. Patient will discharge home with a home exercise program (HEP). Patient has a rolling walker and elevated toilets at home, so there are no DME needs at this time. TOC signing off.    Final next level of care: Home/Self Care Barriers to Discharge: No Barriers Identified  Patient Goals and CMS Choice Patient states their goals for this hospitalization and ongoing recovery are:: Discharge home with HEP Choice offered to / list presented to : NA  Discharge Plan and Services         DME Arranged: N/A DME Agency: NA  Readmission Risk Interventions No flowsheet data found.

## 2021-12-12 NOTE — Discharge Summary (Signed)
Patient ID: Jimmy Frazier MRN: [id number] DOB/AGE: Oct 30, 1951 71 y.o.  Admit date: 12/03/2021 Discharge date: 12/04/2021  Admission Diagnoses:  Left hip osteoarthritis  Discharge Diagnoses:  Principal Problem:   S/P total left hip arthroplasty   Past Medical History:  Diagnosis Date   Arthritis    osteoarthritis   Back pain    occasionally   Diabetes mellitus without complication (HCC)    borderline;but no meds required yet;diet and exercise   Diverticulosis    GERD (gastroesophageal reflux disease)    takes Omeprazole daily prn   Headache(784.0)    hx of cluster HA;but none in a while,02-05-16 no longer a problem   Hyperlipidemia    but doesn't require meds   Hypertension    takes Prinizide and Felodipine daily   Joint pain    Joint swelling    knees only   Muscle spasm    Robaxin daily prn    Surgeries: Procedure(s): TOTAL HIP ARTHROPLASTY ANTERIOR APPROACH on 12/03/2021   Consultants:   Discharged Condition: Improved  Hospital Course: Jimmy Frazier is an 71 y.o. male who was admitted 12/03/2021 for operative treatment ofS/P total left hip arthroplasty. Patient has severe unremitting pain that affects sleep, daily activities, and work/hobbies. After pre-op clearance the patient was taken to the operating room on 12/03/2021 and underwent  Procedure(s): TOTAL HIP ARTHROPLASTY ANTERIOR APPROACH.    Patient was given perioperative antibiotics:  Anti-infectives (From admission, onward)    Start     Dose/Rate Route Frequency Ordered Stop   12/03/21 2000  ceFAZolin (ANCEF) IVPB 2g/100 mL premix        2 g 200 mL/hr over 30 Minutes Intravenous Every 6 hours 12/03/21 1759 12/04/21 0311   12/03/21 1200  ceFAZolin (ANCEF) IVPB 2g/100 mL premix        2 g 200 mL/hr over 30 Minutes Intravenous On call to O.R. 12/03/21 1155 12/03/21 1505        Patient was given sequential compression devices, early ambulation, and chemoprophylaxis to prevent DVT. Patient worked with PT  and was meeting their goals regarding safe ambulation and transfers.  Patient benefited maximally from hospital stay and there were no complications.    Recent vital signs: No data found.   Recent laboratory studies: No results for input(s): WBC, HGB, HCT, PLT, NA, K, CL, CO2, BUN, CREATININE, GLUCOSE, INR, CALCIUM in the last 72 hours.  Invalid input(s): PT, 2   Discharge Medications:   Allergies as of 12/04/2021   No Known Allergies      Medication List     STOP taking these medications    ferrous sulfate 325 (65 FE) MG tablet Commonly known as: FerrouSul   HYDROcodone-acetaminophen 7.5-325 MG tablet Commonly known as: Norco Replaced by: HYDROcodone-acetaminophen 5-325 MG tablet       TAKE these medications    aspirin 81 MG chewable tablet Chew 1 tablet (81 mg total) by mouth 2 (two) times daily for 28 days.   docusate sodium 100 MG capsule Commonly known as: Colace Take 1 capsule (100 mg total) by mouth 2 (two) times daily.   felodipine 5 MG 24 hr tablet Commonly known as: PLENDIL Take 5 mg by mouth daily.   HYDROcodone-acetaminophen 5-325 MG tablet Commonly known as: NORCO/VICODIN Take 1-2 tablets by mouth every 6 (six) hours as needed for severe pain. Replaces: HYDROcodone-acetaminophen 7.5-325 MG tablet   lisinopril-hydrochlorothiazide 10-12.5 MG tablet Commonly known as: ZESTORETIC Take 1 tablet by mouth daily.   metFORMIN 500 MG 24  hr tablet Commonly known as: GLUCOPHAGE-XR Take 1,000 mg by mouth daily.   methocarbamol 500 MG tablet Commonly known as: ROBAXIN Take 1 tablet (500 mg total) by mouth every 6 (six) hours as needed for muscle spasms.   omeprazole 40 MG capsule Commonly known as: PRILOSEC Take 40 mg by mouth daily as needed (For heartburn or acid reflux.).   polyethylene glycol 17 g packet Commonly known as: MIRALAX / GLYCOLAX Take 17 g by mouth 2 (two) times daily.   sertraline 50 MG tablet Commonly known as: ZOLOFT Take 50 mg  by mouth 2 (two) times a week.   simvastatin 20 MG tablet Commonly known as: ZOCOR Take 20 mg by mouth every morning.               Discharge Care Instructions  (From admission, onward)           Start     Ordered   12/04/21 0000  Change dressing       Comments: Maintain surgical dressing until follow up in the clinic. If the edges start to pull up, may reinforce with tape. If the dressing is no longer working, may remove and cover with gauze and tape, but must keep the area dry and clean.  Call with any questions or concerns.   12/04/21 0738            Diagnostic Studies: DG Pelvis Portable  Result Date: 12/03/2021 CLINICAL DATA:  Hip replacement EXAM: PORTABLE PELVIS 1-2 VIEWS COMPARISON:  Same day intraoperative images, radiograph 10/04/2019 FINDINGS: Left total hip arthroplasty is in normal alignment without evidence of loosening or periprosthetic fracture. Expected soft tissue changes. Unchanged right hip arthroplasty. IMPRESSION: Left total hip arthroplasty without evidence of immediate hardware complication. Electronically Signed   By: Maurine Simmering M.D.   On: 12/03/2021 17:00   DG C-Arm 1-60 Min-No Report  Result Date: 12/03/2021 Fluoroscopy was utilized by the requesting physician.  No radiographic interpretation.   DG HIP OPERATIVE UNILAT W OR W/O PELVIS RIGHT  Result Date: 12/03/2021 CLINICAL DATA:  Left hip arthroplasty EXAM: OPERATIVE choose 1 HIP (WITH PELVIS IF PERFORMED) 2 VIEWS TECHNIQUE: Fluoroscopic spot image(s) were submitted for interpretation post-operatively. COMPARISON:  10/04/2019 FINDINGS: 11 fluoroscopic images are obtained during the performance of the procedure and are submitted for interpretation only. Initial images demonstrate left hip osteoarthritis. Subsequent placement of a left hip arthroplasty in the expected position. Previous right hip arthroplasty is unchanged. Please refer to operative report. FLUOROSCOPY TIME:  12 seconds, 1.7598 mGy  IMPRESSION: 1. Intraoperative evaluation as above. Please refer to the operative report. Electronically Signed   By: Randa Ngo M.D.   On: 12/03/2021 16:10    Disposition: Discharge disposition: 01-Home or Self Care       Discharge Instructions     Call MD / Call 911   Complete by: As directed    If you experience chest pain or shortness of breath, CALL 911 and be transported to the hospital emergency room.  If you develope a fever above 101 F, pus (white drainage) or increased drainage or redness at the wound, or calf pain, call your surgeon's office.   Change dressing   Complete by: As directed    Maintain surgical dressing until follow up in the clinic. If the edges start to pull up, may reinforce with tape. If the dressing is no longer working, may remove and cover with gauze and tape, but must keep the area dry and clean.  Call  with any questions or concerns.   Constipation Prevention   Complete by: As directed    Drink plenty of fluids.  Prune juice may be helpful.  You may use a stool softener, such as Colace (over the counter) 100 mg twice a day.  Use MiraLax (over the counter) for constipation as needed.   Diet - low sodium heart healthy   Complete by: As directed    Increase activity slowly as tolerated   Complete by: As directed    Weight bearing as tolerated with assist device (walker, cane, etc) as directed, use it as long as suggested by your surgeon or therapist, typically at least 4-6 weeks.   Post-operative opioid taper instructions:   Complete by: As directed    POST-OPERATIVE OPIOID TAPER INSTRUCTIONS: It is important to wean off of your opioid medication as soon as possible. If you do not need pain medication after your surgery it is ok to stop day one. Opioids include: Codeine, Hydrocodone(Norco, Vicodin), Oxycodone(Percocet, oxycontin) and hydromorphone amongst others.  Long term and even short term use of opiods can cause: Increased pain  response Dependence Constipation Depression Respiratory depression And more.  Withdrawal symptoms can include Flu like symptoms Nausea, vomiting And more Techniques to manage these symptoms Hydrate well Eat regular healthy meals Stay active Use relaxation techniques(deep breathing, meditating, yoga) Do Not substitute Alcohol to help with tapering If you have been on opioids for less than two weeks and do not have pain than it is ok to stop all together.  Plan to wean off of opioids This plan should start within one week post op of your joint replacement. Maintain the same interval or time between taking each dose and first decrease the dose.  Cut the total daily intake of opioids by one tablet each day Next start to increase the time between doses. The last dose that should be eliminated is the evening dose.      TED hose   Complete by: As directed    Use stockings (TED hose) for 2 weeks on both leg(s).  You may remove them at night for sleeping.        Follow-up Information     Paralee Cancel, MD. Schedule an appointment as soon as possible for a visit in 2 week(s).   Specialty: Orthopedic Surgery Contact information: [address] [place] [address] [phone]                  Signed: Irving Copas 12/12/2021, 12:55 PM

## 2022-07-10 IMAGING — DX DG PORTABLE PELVIS
1 series · 2 of 2 positions shown · non-contrast
Comparison: Same day intraoperative images, radiograph 10/04/2019

CLINICAL DATA: Hip replacement

EXAM:
PORTABLE PELVIS 1-2 VIEWS

[Series 2: pelvis ap · 0.14mm/px · 2 of 2 slices shown]
[im 1/2]
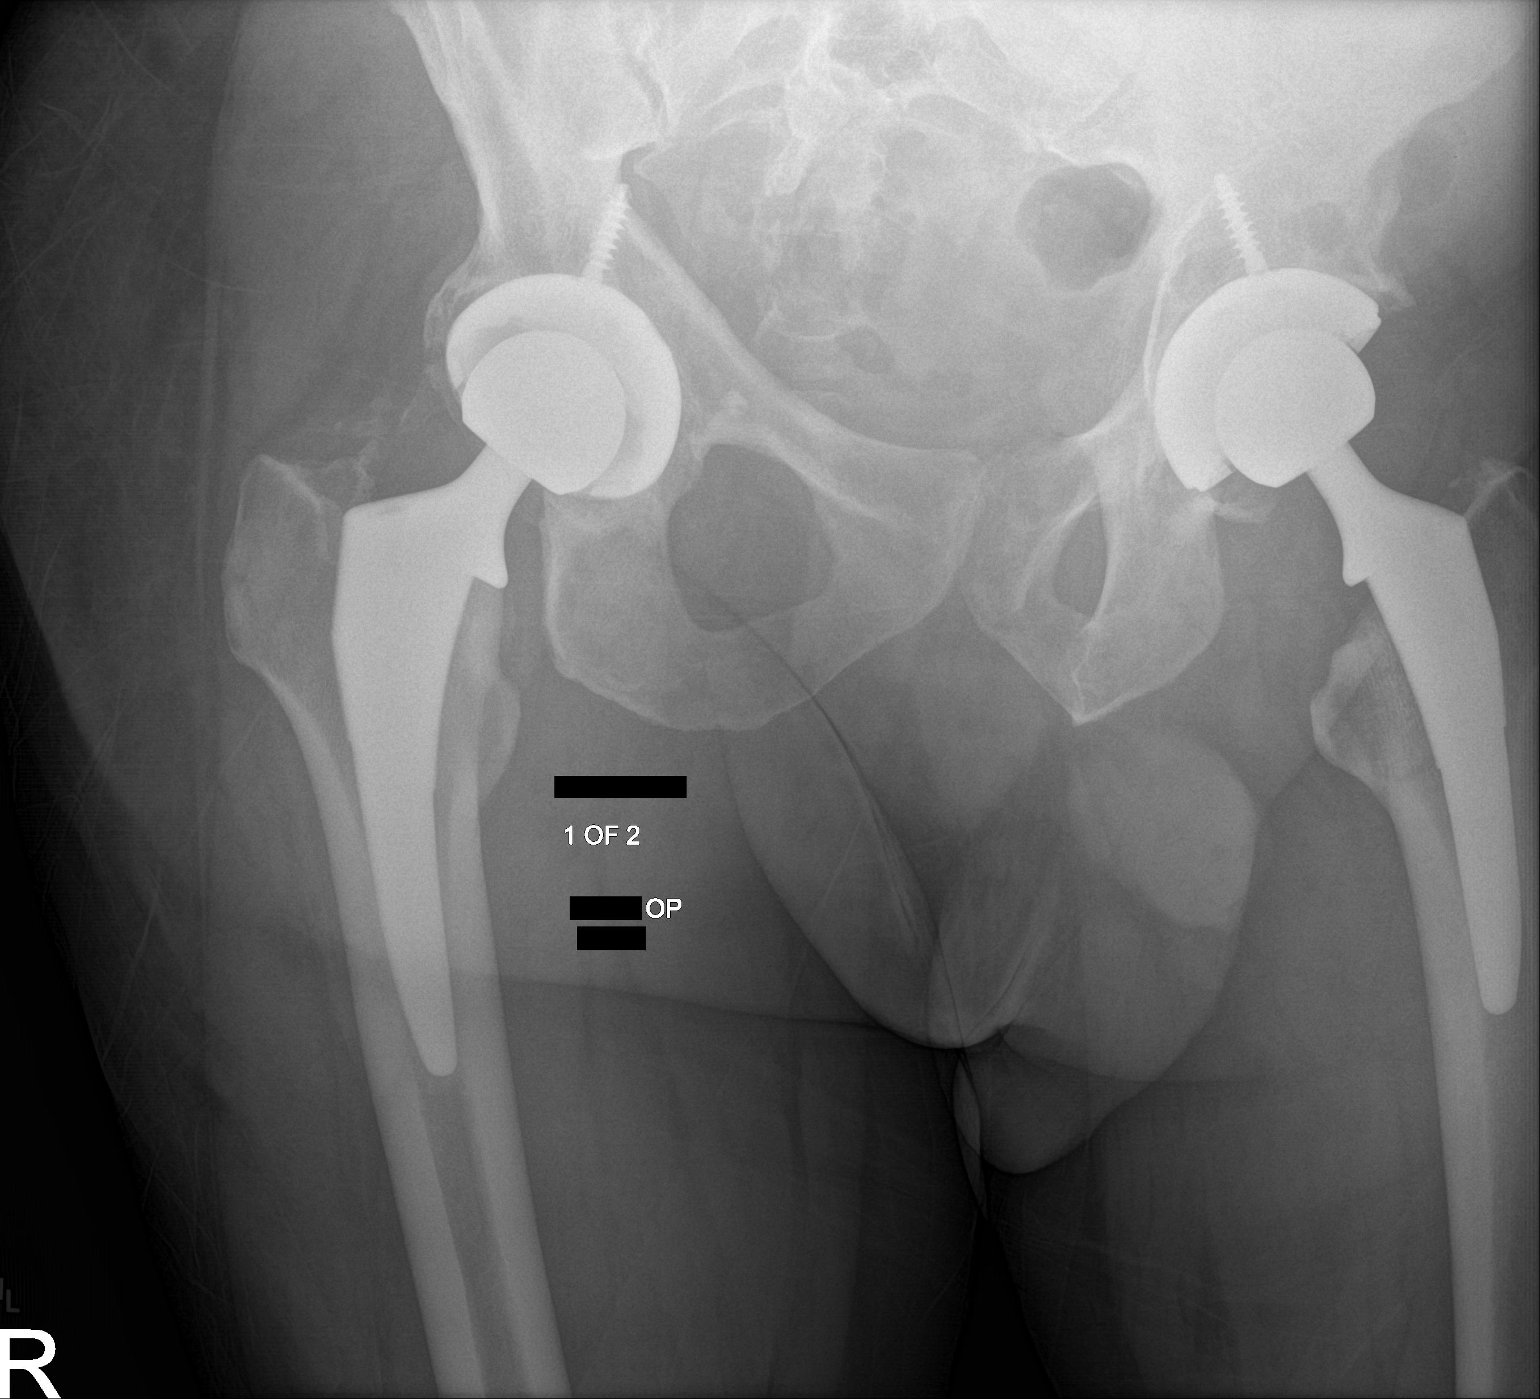
[im 2/2]
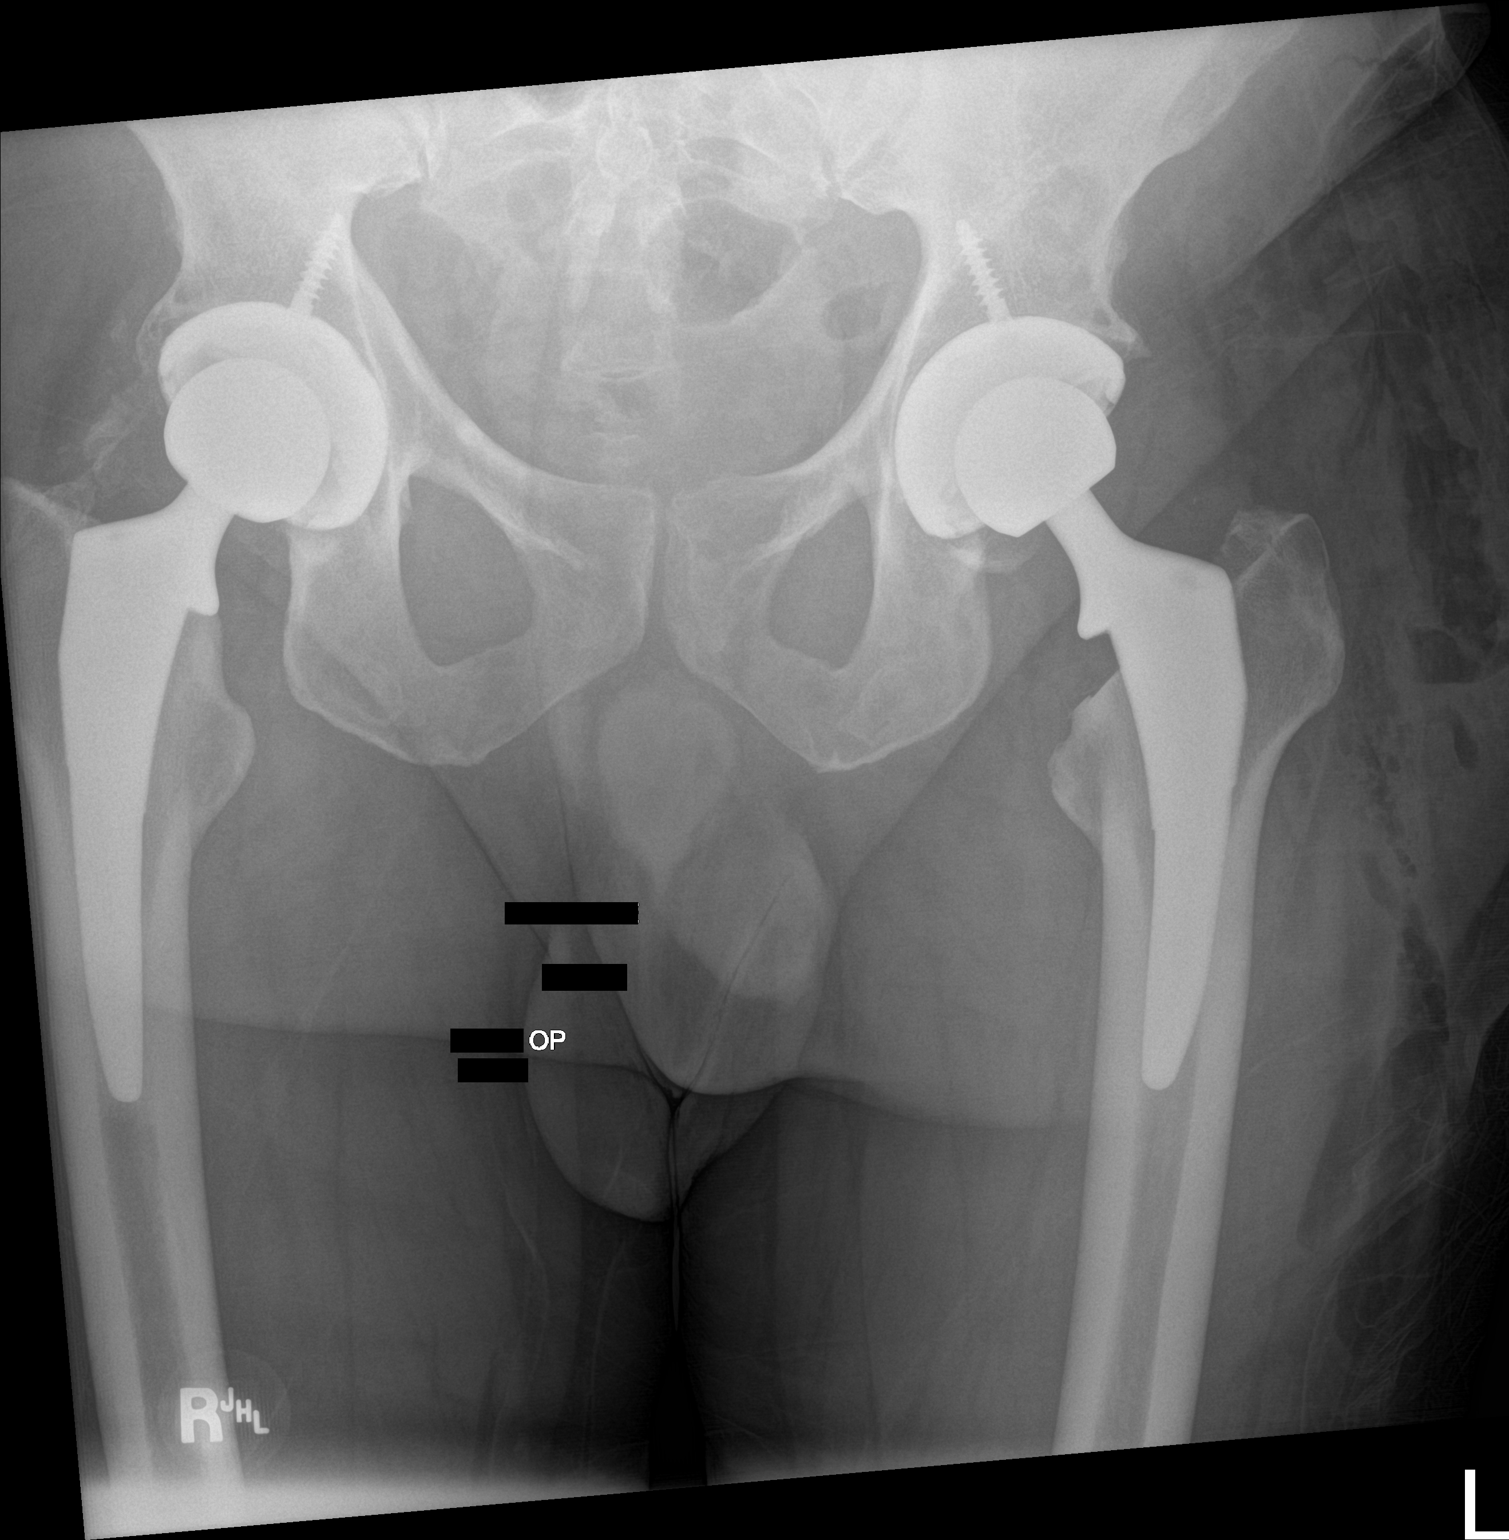

[2 of 2 positions shown; findings below may reference images not displayed]

FINDINGS: Left total hip arthroplasty is in normal alignment without evidence
of loosening or periprosthetic fracture. Expected soft tissue
changes. Unchanged right hip arthroplasty.
IMPRESSION: Left total hip arthroplasty without evidence of immediate hardware
complication.

## 2022-07-10 IMAGING — RF DG HIP (WITH PELVIS) OPERATIVE*R*
1 series · 11 of 11 positions shown · non-contrast
Comparison: 10/04/2019

CLINICAL DATA: Left hip arthroplasty

EXAM:
OPERATIVE choose 1 HIP (WITH PELVIS IF PERFORMED) 2 VIEWS
TECHNIQUE: Fluoroscopic spot image(s) were submitted for interpretation
post-operatively.

[Series 1: unknown protocol · 0.20mm/px · 11 of 11 slices shown]
[im 1/11]
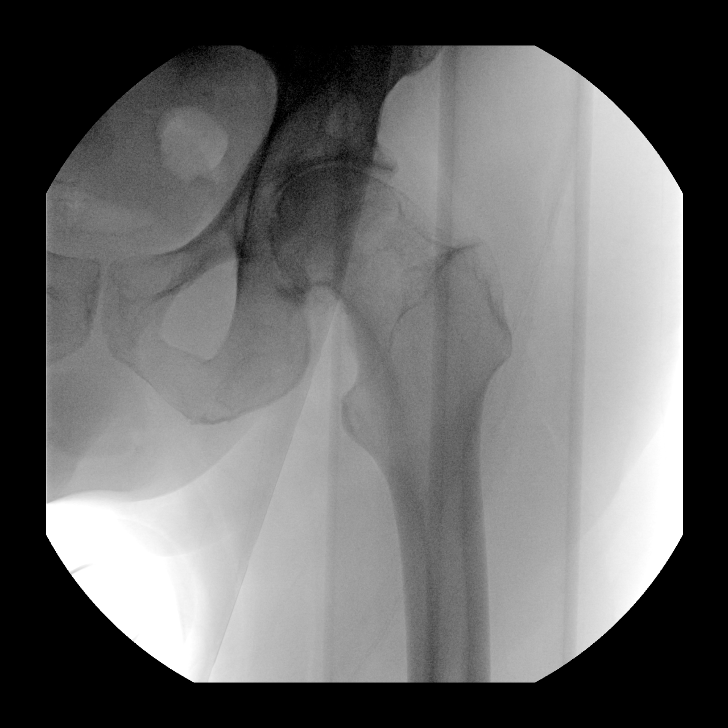
[im 2/11]
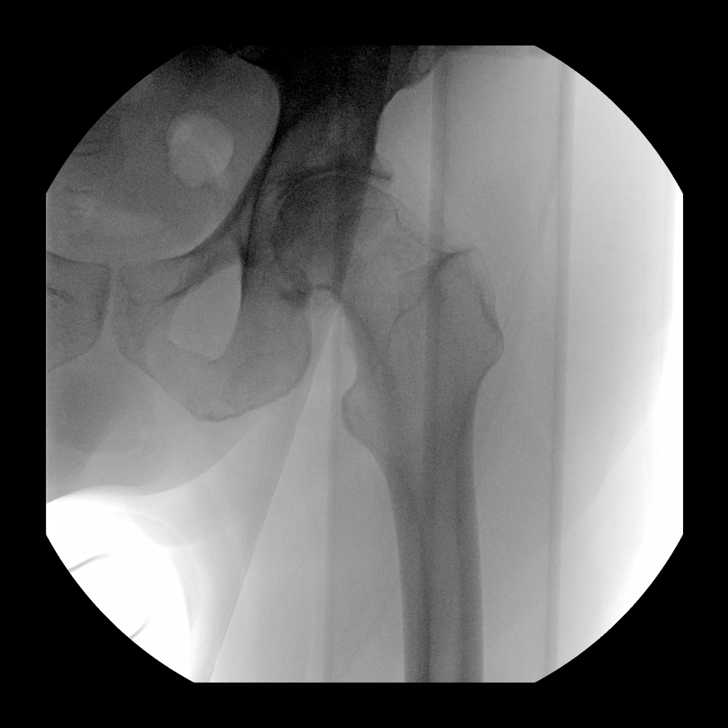
[im 3/11]
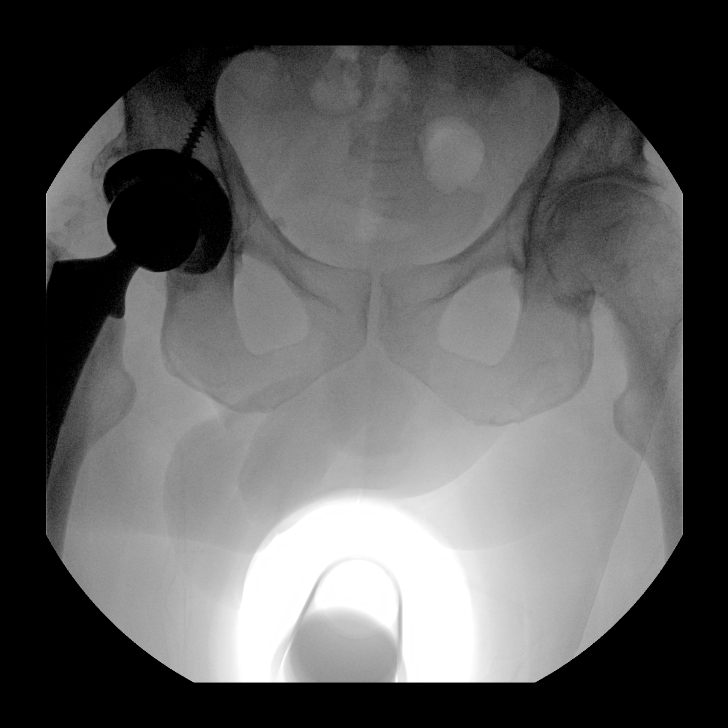
[im 4/11]
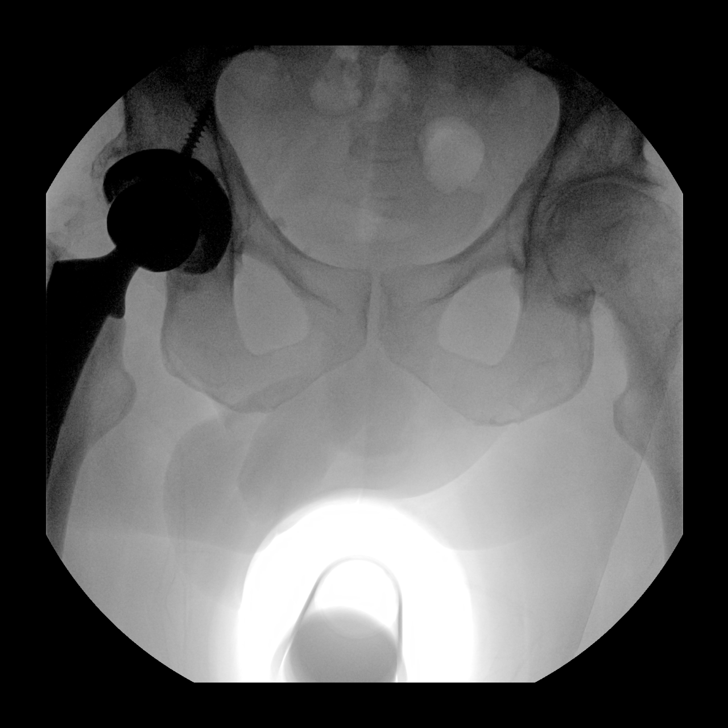
[im 5/11]
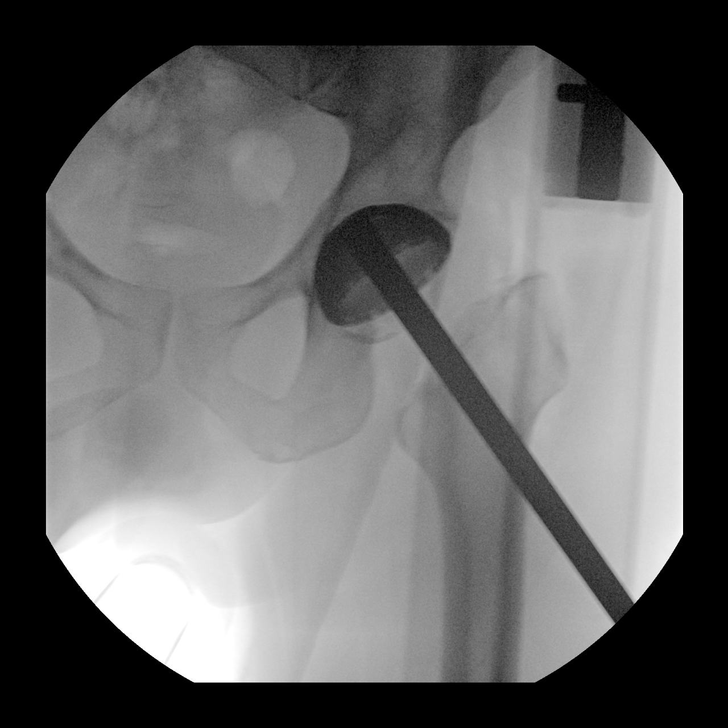
[im 6/11]
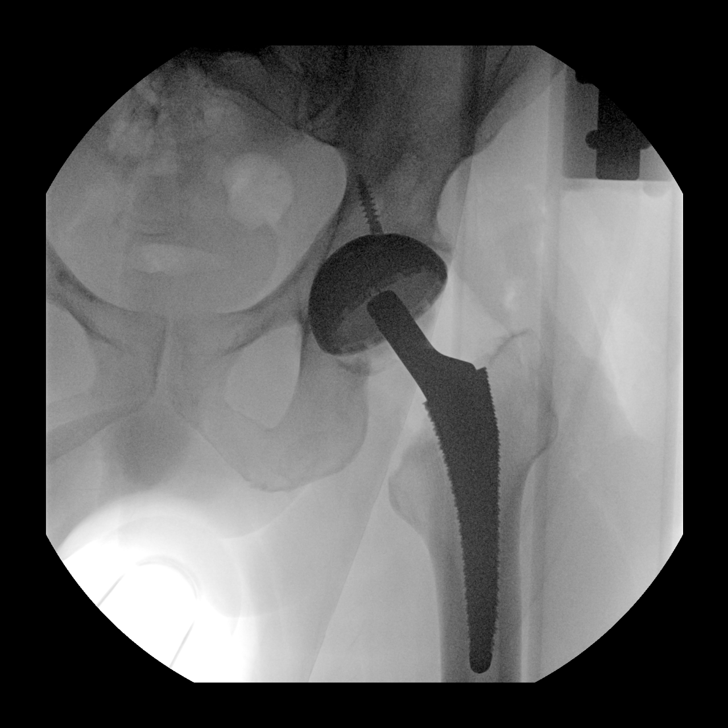
[im 7/11]
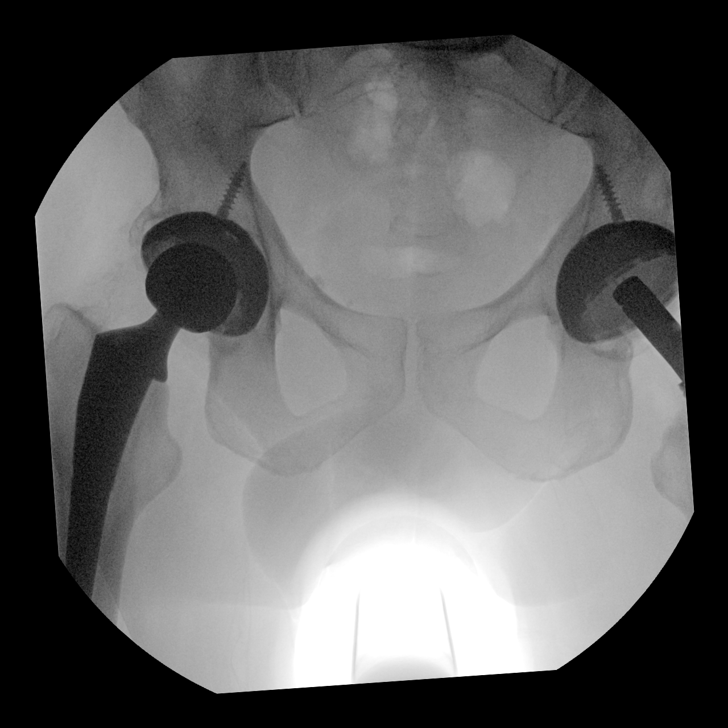
[im 8/11]
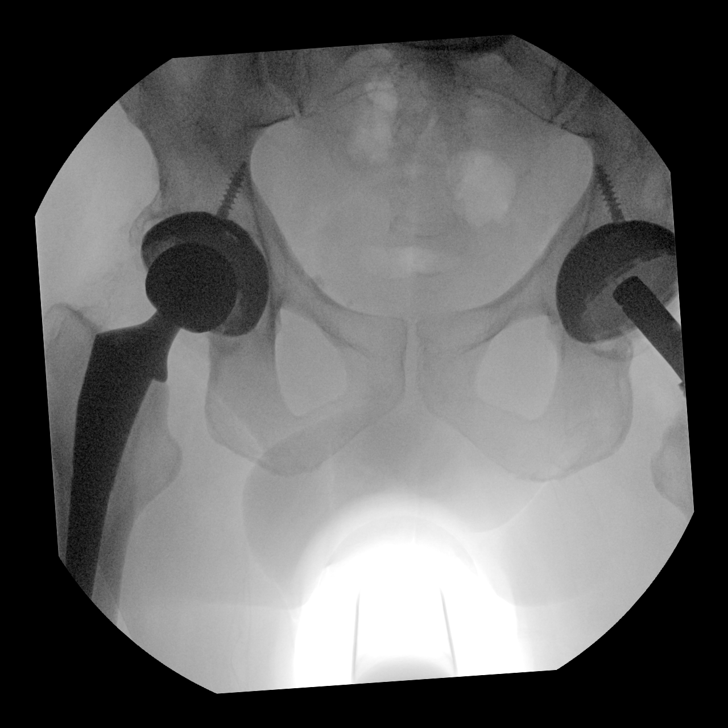
[im 9/11]
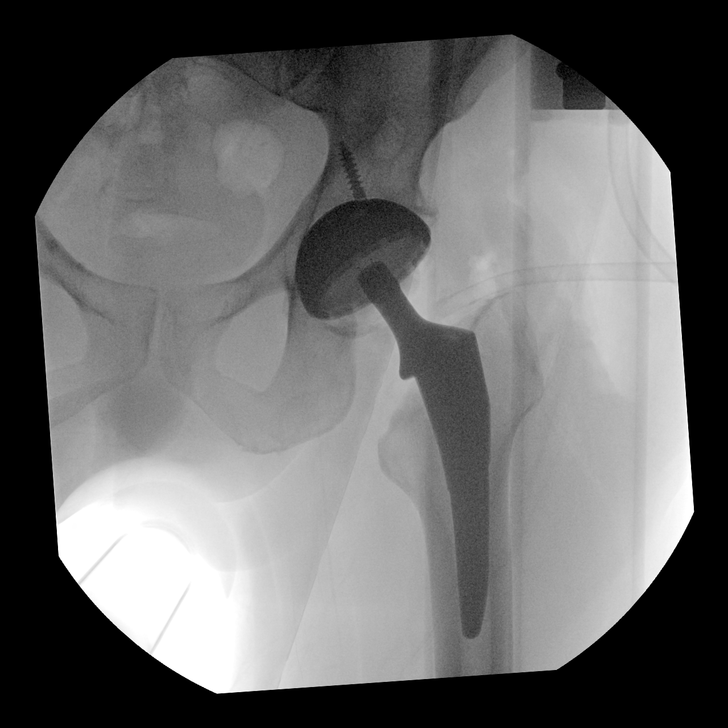
[im 10/11]
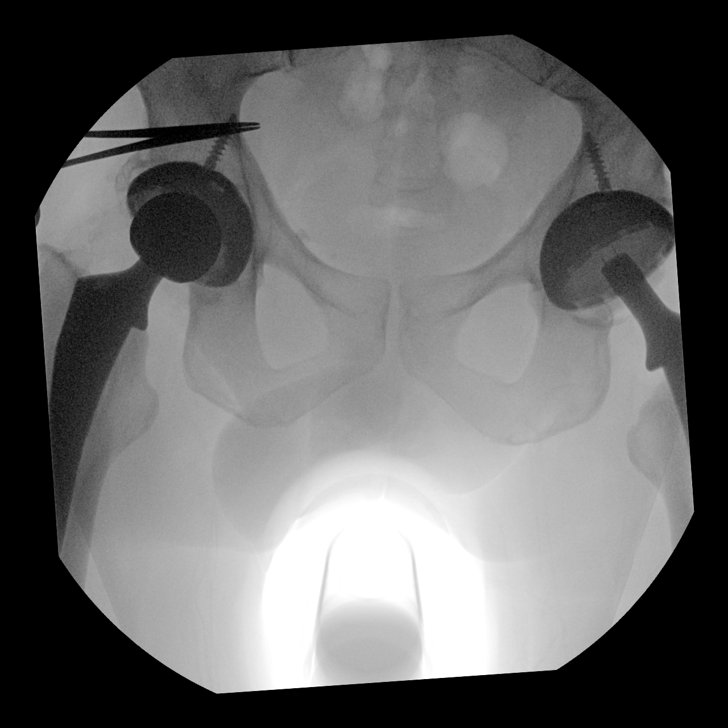
[im 11/11]
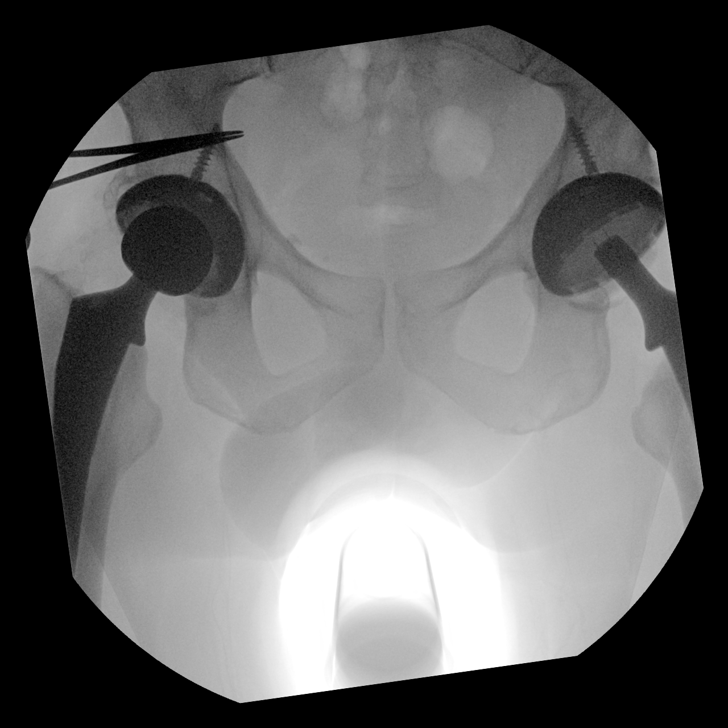

[11 of 11 positions shown; findings below may reference images not displayed]

FINDINGS: 11 fluoroscopic images are obtained during the performance of the
procedure and are submitted for interpretation only. Initial images
demonstrate left hip osteoarthritis. Subsequent placement of a left
hip arthroplasty in the expected position. Previous right hip
arthroplasty is unchanged. Please refer to operative report.

FLUOROSCOPY TIME:  12 seconds, 1.7598 mGy
IMPRESSION: 1. Intraoperative evaluation as above. Please refer to the operative
report.
